# Patient Record
Sex: Female | Born: 1970 | Race: Black or African American | Hispanic: No | Marital: Single | Smoking: Never smoker
Health system: Southern US, Community
[De-identification: ages and names within clinical notes are randomized; demographics above are authoritative.]

## PROBLEM LIST (undated history)

## (undated) DIAGNOSIS — E119 Type 2 diabetes mellitus without complications: Secondary | ICD-10-CM

## (undated) HISTORY — PX: TUBAL LIGATION: SHX77

---

## 2018-05-07 ENCOUNTER — Ambulatory Visit: Payer: BLUE CROSS/BLUE SHIELD | Admitting: Dietician

## 2018-05-20 ENCOUNTER — Encounter: Payer: Self-pay | Admitting: Dietician

## 2018-05-20 NOTE — Progress Notes (Signed)
Patient cancelled her appointment for 05/07/18 as her insurance is not covering MNT service for diabetes. Sent letter to referring provider.

## 2020-12-06 ENCOUNTER — Encounter (HOSPITAL_COMMUNITY): Payer: Self-pay

## 2020-12-06 ENCOUNTER — Emergency Department (HOSPITAL_COMMUNITY): Payer: BC Managed Care – PPO

## 2020-12-06 ENCOUNTER — Encounter (HOSPITAL_COMMUNITY)
Admission: EM | Disposition: A | Discharge status: Home / Self Care | Payer: Self-pay | Source: Home / Self Care | Attending: Interventional Cardiology

## 2020-12-06 ENCOUNTER — Other Ambulatory Visit: Payer: Self-pay

## 2020-12-06 ENCOUNTER — Inpatient Hospital Stay (HOSPITAL_COMMUNITY)
Admission: EM | Admit: 2020-12-06 | Discharge: 2020-12-08 | DRG: 250 | Disposition: A | Discharge status: Home / Self Care | Payer: BC Managed Care – PPO | Attending: Interventional Cardiology | Admitting: Interventional Cardiology

## 2020-12-06 DIAGNOSIS — I10 Essential (primary) hypertension: Secondary | ICD-10-CM

## 2020-12-06 DIAGNOSIS — I5043 Acute on chronic combined systolic (congestive) and diastolic (congestive) heart failure: Secondary | ICD-10-CM | POA: Diagnosis present

## 2020-12-06 DIAGNOSIS — I472 Ventricular tachycardia: Secondary | ICD-10-CM | POA: Diagnosis present

## 2020-12-06 DIAGNOSIS — I4729 Other ventricular tachycardia: Secondary | ICD-10-CM

## 2020-12-06 DIAGNOSIS — E7800 Pure hypercholesterolemia, unspecified: Secondary | ICD-10-CM | POA: Diagnosis present

## 2020-12-06 DIAGNOSIS — Z6833 Body mass index (BMI) 33.0-33.9, adult: Secondary | ICD-10-CM | POA: Diagnosis not present

## 2020-12-06 DIAGNOSIS — Z794 Long term (current) use of insulin: Secondary | ICD-10-CM | POA: Diagnosis not present

## 2020-12-06 DIAGNOSIS — Z20822 Contact with and (suspected) exposure to covid-19: Secondary | ICD-10-CM | POA: Diagnosis present

## 2020-12-06 DIAGNOSIS — Z87892 Personal history of anaphylaxis: Secondary | ICD-10-CM | POA: Diagnosis not present

## 2020-12-06 DIAGNOSIS — E669 Obesity, unspecified: Secondary | ICD-10-CM | POA: Diagnosis present

## 2020-12-06 DIAGNOSIS — E785 Hyperlipidemia, unspecified: Secondary | ICD-10-CM | POA: Diagnosis not present

## 2020-12-06 DIAGNOSIS — IMO0002 Reserved for concepts with insufficient information to code with codable children: Secondary | ICD-10-CM

## 2020-12-06 DIAGNOSIS — Z888 Allergy status to other drugs, medicaments and biological substances status: Secondary | ICD-10-CM

## 2020-12-06 DIAGNOSIS — Z9851 Tubal ligation status: Secondary | ICD-10-CM

## 2020-12-06 DIAGNOSIS — I110 Hypertensive heart disease with heart failure: Secondary | ICD-10-CM | POA: Diagnosis present

## 2020-12-06 DIAGNOSIS — I2129 ST elevation (STEMI) myocardial infarction involving other sites: Principal | ICD-10-CM | POA: Diagnosis present

## 2020-12-06 DIAGNOSIS — I2582 Chronic total occlusion of coronary artery: Secondary | ICD-10-CM | POA: Diagnosis present

## 2020-12-06 DIAGNOSIS — I213 ST elevation (STEMI) myocardial infarction of unspecified site: Secondary | ICD-10-CM | POA: Diagnosis present

## 2020-12-06 DIAGNOSIS — E1165 Type 2 diabetes mellitus with hyperglycemia: Secondary | ICD-10-CM

## 2020-12-06 DIAGNOSIS — Z91013 Allergy to seafood: Secondary | ICD-10-CM

## 2020-12-06 DIAGNOSIS — I2102 ST elevation (STEMI) myocardial infarction involving left anterior descending coronary artery: Secondary | ICD-10-CM | POA: Diagnosis not present

## 2020-12-06 DIAGNOSIS — I2510 Atherosclerotic heart disease of native coronary artery without angina pectoris: Secondary | ICD-10-CM | POA: Diagnosis present

## 2020-12-06 HISTORY — PX: LEFT HEART CATH AND CORONARY ANGIOGRAPHY: CATH118249

## 2020-12-06 HISTORY — DX: Type 2 diabetes mellitus without complications: E11.9

## 2020-12-06 HISTORY — PX: CORONARY/GRAFT ACUTE MI REVASCULARIZATION: CATH118305

## 2020-12-06 LAB — CBC WITH DIFFERENTIAL/PLATELET
Abs Immature Granulocytes: 0.01 10*3/uL (ref 0.00–0.07)
Basophils Absolute: 0 10*3/uL (ref 0.0–0.1)
Basophils Relative: 0 %
Eosinophils Absolute: 0.2 10*3/uL (ref 0.0–0.5)
Eosinophils Relative: 3 %
HCT: 39.9 % (ref 36.0–46.0)
Hemoglobin: 13.3 g/dL (ref 12.0–15.0)
Immature Granulocytes: 0 %
Lymphocytes Relative: 39 %
Lymphs Abs: 2.3 10*3/uL (ref 0.7–4.0)
MCH: 27.5 pg (ref 26.0–34.0)
MCHC: 33.3 g/dL (ref 30.0–36.0)
MCV: 82.4 fL (ref 80.0–100.0)
Monocytes Absolute: 0.3 10*3/uL (ref 0.1–1.0)
Monocytes Relative: 6 %
Neutro Abs: 3.1 10*3/uL (ref 1.7–7.7)
Neutrophils Relative %: 52 %
Platelets: 218 10*3/uL (ref 150–400)
RBC: 4.84 MIL/uL (ref 3.87–5.11)
RDW: 11.3 % — ABNORMAL LOW (ref 11.5–15.5)
WBC: 6 10*3/uL (ref 4.0–10.5)
nRBC: 0 % (ref 0.0–0.2)

## 2020-12-06 LAB — CBC
HCT: 39.6 % (ref 36.0–46.0)
Hemoglobin: 13.7 g/dL (ref 12.0–15.0)
MCH: 28 pg (ref 26.0–34.0)
MCHC: 34.6 g/dL (ref 30.0–36.0)
MCV: 80.8 fL (ref 80.0–100.0)
Platelets: 224 10*3/uL (ref 150–400)
RBC: 4.9 MIL/uL (ref 3.87–5.11)
RDW: 11.4 % — ABNORMAL LOW (ref 11.5–15.5)
WBC: 7.6 10*3/uL (ref 4.0–10.5)
nRBC: 0 % (ref 0.0–0.2)

## 2020-12-06 LAB — BASIC METABOLIC PANEL
Anion gap: 13 (ref 5–15)
BUN: 11 mg/dL (ref 6–20)
CO2: 21 mmol/L — ABNORMAL LOW (ref 22–32)
Calcium: 9.7 mg/dL (ref 8.9–10.3)
Chloride: 97 mmol/L — ABNORMAL LOW (ref 98–111)
Creatinine, Ser: 0.75 mg/dL (ref 0.44–1.00)
GFR, Estimated: >60 mL/min (ref >60)
Glucose, Bld: 359 mg/dL — ABNORMAL HIGH (ref 70–99)
Potassium: 4.3 mmol/L (ref 3.5–5.1)
Sodium: 131 mmol/L — ABNORMAL LOW (ref 135–145)

## 2020-12-06 LAB — COMPREHENSIVE METABOLIC PANEL
ALT: 17 U/L (ref 0–44)
AST: 16 U/L (ref 15–41)
Albumin: 3.7 g/dL (ref 3.5–5.0)
Alkaline Phosphatase: 90 U/L (ref 38–126)
Anion gap: 11 (ref 5–15)
BUN: 13 mg/dL (ref 6–20)
CO2: 24 mmol/L (ref 22–32)
Calcium: 10 mg/dL (ref 8.9–10.3)
Chloride: 96 mmol/L — ABNORMAL LOW (ref 98–111)
Creatinine, Ser: 0.65 mg/dL (ref 0.44–1.00)
GFR, Estimated: >60 mL/min (ref >60)
Glucose, Bld: 383 mg/dL — ABNORMAL HIGH (ref 70–99)
Potassium: 3.8 mmol/L (ref 3.5–5.1)
Sodium: 131 mmol/L — ABNORMAL LOW (ref 135–145)
Total Bilirubin: 0.7 mg/dL (ref 0.3–1.2)
Total Protein: 6.9 g/dL (ref 6.5–8.1)

## 2020-12-06 LAB — POCT ACTIVATED CLOTTING TIME
Activated Clotting Time: 265 seconds
Activated Clotting Time: 358 seconds

## 2020-12-06 LAB — TROPONIN I (HIGH SENSITIVITY)
Troponin I (High Sensitivity): 435 ng/L (ref <18)
Troponin I (High Sensitivity): 369 ng/L (ref <18)
Troponin I (High Sensitivity): 2498 ng/L (ref <18)
Troponin I (High Sensitivity): 8192 ng/L (ref <18)

## 2020-12-06 LAB — CREATININE, SERUM
Creatinine, Ser: 0.67 mg/dL (ref 0.44–1.00)
GFR, Estimated: >60 mL/min (ref >60)

## 2020-12-06 LAB — MAGNESIUM: Magnesium: 1.5 mg/dL — ABNORMAL LOW (ref 1.7–2.4)

## 2020-12-06 LAB — HEMOGLOBIN A1C
Hgb A1c MFr Bld: 13.5 % — ABNORMAL HIGH (ref 4.8–5.6)
Mean Plasma Glucose: 340.75 mg/dL

## 2020-12-06 LAB — LIPASE, BLOOD: Lipase: 26 U/L (ref 11–51)

## 2020-12-06 LAB — RESP PANEL BY RT-PCR (FLU A&B, COVID) ARPGX2
Influenza A by PCR: NEGATIVE
Influenza B by PCR: NEGATIVE
SARS Coronavirus 2 by RT PCR: NEGATIVE

## 2020-12-06 LAB — GLUCOSE, CAPILLARY: Glucose-Capillary: 362 mg/dL — ABNORMAL HIGH (ref 70–99)

## 2020-12-06 SURGERY — LEFT HEART CATH AND CORONARY ANGIOGRAPHY
Anesthesia: LOCAL

## 2020-12-06 MED ORDER — METOPROLOL TARTRATE 25 MG PO TABS
25.0000 mg | ORAL_TABLET | Freq: Two times a day (BID) | ORAL | Status: DC
  Administered 2020-12-06 – 2020-12-08 (×4): 25 mg via ORAL
  Filled 2020-12-06 (×4): qty 1

## 2020-12-06 MED ORDER — NITROGLYCERIN 1 MG/10 ML FOR IR/CATH LAB
INTRA_ARTERIAL | Status: DC | PRN
  Administered 2020-12-06 (×2): 200 ug via INTRACORONARY

## 2020-12-06 MED ORDER — NITROGLYCERIN 1 MG/10 ML FOR IR/CATH LAB
INTRA_ARTERIAL | Status: AC
  Filled 2020-12-06: qty 10

## 2020-12-06 MED ORDER — NITROGLYCERIN IN D5W 200-5 MCG/ML-% IV SOLN
INTRAVENOUS | Status: AC
  Filled 2020-12-06: qty 250

## 2020-12-06 MED ORDER — INSULIN ASPART 100 UNIT/ML IJ SOLN
0.0000 [IU] | Freq: Every day | INTRAMUSCULAR | Status: DC
Start: 2020-12-06 — End: 2020-12-08
  Administered 2020-12-06: 5 [IU] via SUBCUTANEOUS

## 2020-12-06 MED ORDER — SODIUM CHLORIDE 0.9 % IV SOLN: INTRAVENOUS | Status: AC

## 2020-12-06 MED ORDER — METOPROLOL TARTRATE 25 MG PO TABS: 25.0000 mg | ORAL_TABLET | Freq: Two times a day (BID) | ORAL | Status: DC

## 2020-12-06 MED ORDER — LIDOCAINE HCL (PF) 1 % IJ SOLN
INTRAMUSCULAR | Status: AC
  Filled 2020-12-06: qty 30

## 2020-12-06 MED ORDER — FENTANYL CITRATE (PF) 100 MCG/2ML IJ SOLN
INTRAMUSCULAR | Status: AC
  Filled 2020-12-06: qty 2

## 2020-12-06 MED ORDER — CLOPIDOGREL BISULFATE 75 MG PO TABS: 75.0000 mg | ORAL_TABLET | Freq: Every day | ORAL | Status: DC

## 2020-12-06 MED ORDER — OXYCODONE HCL 5 MG PO TABS: 5.0000 mg | ORAL_TABLET | ORAL | Status: DC | PRN

## 2020-12-06 MED ORDER — LIDOCAINE HCL (PF) 1 % IJ SOLN
INTRAMUSCULAR | Status: DC | PRN
  Administered 2020-12-06: 2 mL

## 2020-12-06 MED ORDER — ATORVASTATIN CALCIUM 80 MG PO TABS
80.0000 mg | ORAL_TABLET | Freq: Every day | ORAL | Status: DC
  Administered 2020-12-06 – 2020-12-07 (×2): 80 mg via ORAL
  Filled 2020-12-06 (×3): qty 1

## 2020-12-06 MED ORDER — ENOXAPARIN SODIUM 40 MG/0.4ML IJ SOSY
40.0000 mg | PREFILLED_SYRINGE | INTRAMUSCULAR | Status: DC
  Administered 2020-12-07 – 2020-12-08 (×2): 40 mg via SUBCUTANEOUS
  Filled 2020-12-06 (×2): qty 0.4

## 2020-12-06 MED ORDER — HEPARIN SODIUM (PORCINE) 1000 UNIT/ML IJ SOLN
INTRAMUSCULAR | Status: DC | PRN
  Administered 2020-12-06: 3000 [IU] via INTRAVENOUS

## 2020-12-06 MED ORDER — SODIUM CHLORIDE 0.9 % IV SOLN: 250.0000 mL | INTRAVENOUS | Status: DC | PRN

## 2020-12-06 MED ORDER — INSULIN ASPART 100 UNIT/ML IJ SOLN
0.0000 [IU] | Freq: Three times a day (TID) | INTRAMUSCULAR | Status: DC
Start: 2020-12-06 — End: 2020-12-08
  Administered 2020-12-07 (×3): 5 [IU] via SUBCUTANEOUS
  Administered 2020-12-08 (×2): 3 [IU] via SUBCUTANEOUS

## 2020-12-06 MED ORDER — HEPARIN SODIUM (PORCINE) 1000 UNIT/ML IJ SOLN
INTRAMUSCULAR | Status: DC | PRN
  Administered 2020-12-06: 5000 [IU] via INTRAVENOUS

## 2020-12-06 MED ORDER — VERAPAMIL HCL 2.5 MG/ML IV SOLN
INTRAVENOUS | Status: AC
  Filled 2020-12-06: qty 2

## 2020-12-06 MED ORDER — HEPARIN BOLUS VIA INFUSION
4000.0000 [IU] | Freq: Once | INTRAVENOUS | Status: DC
  Filled 2020-12-06: qty 4000

## 2020-12-06 MED ORDER — ONDANSETRON HCL 4 MG/2ML IJ SOLN: 4.0000 mg | Freq: Four times a day (QID) | INTRAMUSCULAR | Status: DC | PRN

## 2020-12-06 MED ORDER — HEPARIN SODIUM (PORCINE) 5000 UNIT/ML IJ SOLN
INTRAMUSCULAR | Status: AC
  Administered 2020-12-06: 4000 [IU]
  Filled 2020-12-06: qty 1

## 2020-12-06 MED ORDER — CLOPIDOGREL BISULFATE 300 MG PO TABS
ORAL_TABLET | ORAL | Status: DC | PRN
  Administered 2020-12-06: 600 mg via ORAL

## 2020-12-06 MED ORDER — HEPARIN (PORCINE) IN NACL 1000-0.9 UT/500ML-% IV SOLN
INTRAVENOUS | Status: DC | PRN
  Administered 2020-12-06: 500 mL

## 2020-12-06 MED ORDER — HEPARIN (PORCINE) 25000 UT/250ML-% IV SOLN: 1000.0000 [IU]/h | INTRAVENOUS | Status: DC

## 2020-12-06 MED ORDER — LABETALOL HCL 5 MG/ML IV SOLN: 10.0000 mg | INTRAVENOUS | Status: AC | PRN

## 2020-12-06 MED ORDER — NITROGLYCERIN IN D5W 200-5 MCG/ML-% IV SOLN: 0.0000 ug/min | INTRAVENOUS | Status: DC

## 2020-12-06 MED ORDER — ASPIRIN 81 MG PO CHEW
324.0000 mg | CHEWABLE_TABLET | Freq: Once | ORAL | Status: AC
  Administered 2020-12-06: 324 mg via ORAL
  Filled 2020-12-06: qty 4

## 2020-12-06 MED ORDER — IOHEXOL 350 MG/ML SOLN
INTRAVENOUS | Status: DC | PRN
  Administered 2020-12-06: 175 mL via INTRA_ARTERIAL

## 2020-12-06 MED ORDER — SODIUM CHLORIDE 0.9% FLUSH
3.0000 mL | Freq: Two times a day (BID) | INTRAVENOUS | Status: DC
  Administered 2020-12-06 – 2020-12-08 (×4): 3 mL via INTRAVENOUS

## 2020-12-06 MED ORDER — MIDAZOLAM HCL 2 MG/2ML IJ SOLN
INTRAMUSCULAR | Status: DC | PRN
  Administered 2020-12-06: 1 mg via INTRAVENOUS

## 2020-12-06 MED ORDER — MIDAZOLAM HCL 2 MG/2ML IJ SOLN
INTRAMUSCULAR | Status: AC
  Filled 2020-12-06: qty 2

## 2020-12-06 MED ORDER — ASPIRIN 81 MG PO CHEW
81.0000 mg | CHEWABLE_TABLET | Freq: Every day | ORAL | Status: DC
  Administered 2020-12-06 – 2020-12-08 (×3): 81 mg via ORAL
  Filled 2020-12-06 (×3): qty 1

## 2020-12-06 MED ORDER — SODIUM CHLORIDE 0.9% FLUSH: 3.0000 mL | INTRAVENOUS | Status: DC | PRN

## 2020-12-06 MED ORDER — HEPARIN SODIUM (PORCINE) 1000 UNIT/ML IJ SOLN
INTRAMUSCULAR | Status: AC
  Filled 2020-12-06: qty 1

## 2020-12-06 MED ORDER — HEPARIN (PORCINE) IN NACL 1000-0.9 UT/500ML-% IV SOLN
INTRAVENOUS | Status: AC
  Filled 2020-12-06: qty 1000

## 2020-12-06 MED ORDER — NITROGLYCERIN IN D5W 200-5 MCG/ML-% IV SOLN
INTRAVENOUS | Status: AC | PRN
  Administered 2020-12-06: 10 ug/min via INTRAVENOUS

## 2020-12-06 MED ORDER — CLOPIDOGREL BISULFATE 75 MG PO TABS
75.0000 mg | ORAL_TABLET | Freq: Every day | ORAL | Status: DC
  Administered 2020-12-07 – 2020-12-08 (×2): 75 mg via ORAL
  Filled 2020-12-06 (×2): qty 1

## 2020-12-06 MED ORDER — VERAPAMIL HCL 2.5 MG/ML IV SOLN
INTRAVENOUS | Status: DC | PRN
  Administered 2020-12-06: 10 mL via INTRA_ARTERIAL

## 2020-12-06 MED ORDER — FENTANYL CITRATE (PF) 100 MCG/2ML IJ SOLN
INTRAMUSCULAR | Status: DC | PRN
  Administered 2020-12-06: 50 ug via INTRAVENOUS

## 2020-12-06 MED ORDER — CLOPIDOGREL BISULFATE 300 MG PO TABS
ORAL_TABLET | ORAL | Status: AC
  Filled 2020-12-06: qty 2

## 2020-12-06 MED ORDER — HYDRALAZINE HCL 20 MG/ML IJ SOLN: 10.0000 mg | INTRAMUSCULAR | Status: AC | PRN

## 2020-12-06 MED ORDER — NITROGLYCERIN 2 % TD OINT
0.5000 [in_us] | TOPICAL_OINTMENT | Freq: Once | TRANSDERMAL | Status: DC
  Filled 2020-12-06: qty 1

## 2020-12-06 MED ORDER — ACETAMINOPHEN 325 MG PO TABS: 650.0000 mg | ORAL_TABLET | ORAL | Status: DC | PRN

## 2020-12-06 SURGICAL SUPPLY — 17 items
BALLN SAPPHIRE 2.0X12 (BALLOONS) ×2
BALLOON SAPPHIRE 2.0X12 (BALLOONS) ×1 IMPLANT
CATH 5FR JL3.5 JR4 ANG PIG MP (CATHETERS) ×2 IMPLANT
CATH VISTA GUIDE 6FR XB3.5 (CATHETERS) ×2 IMPLANT
DEVICE RAD COMP TR BAND LRG (VASCULAR PRODUCTS) ×2 IMPLANT
GLIDESHEATH SLEND A-KIT 6F 22G (SHEATH) ×2 IMPLANT
GLIDESHEATH SLEND SS 6F .021 (SHEATH) IMPLANT
GUIDEWIRE INQWIRE 1.5J.035X260 (WIRE) ×1 IMPLANT
INQWIRE 1.5J .035X260CM (WIRE) ×2
KIT ENCORE 26 ADVANTAGE (KITS) ×2 IMPLANT
KIT HEART LEFT (KITS) ×2 IMPLANT
MAT PREVALON FULL STRYKER (MISCELLANEOUS) ×2 IMPLANT
PACK CARDIAC CATHETERIZATION (CUSTOM PROCEDURE TRAY) ×2 IMPLANT
SHEATH PROBE COVER 6X72 (BAG) ×2 IMPLANT
TRANSDUCER W/STOPCOCK (MISCELLANEOUS) ×2 IMPLANT
TUBING CIL FLEX 10 FLL-RA (TUBING) ×2 IMPLANT
WIRE ASAHI PROWATER 180CM (WIRE) ×2 IMPLANT

## 2020-12-06 NOTE — ED Notes (Signed)
Attached to zoll, stemi pads, cath lab remains in room preparing patient for cath lab.

## 2020-12-06 NOTE — ED Triage Notes (Signed)
Pt reports chest pain that started last night, took antacids to see if that would help but no relief. Pt denies any other symptoms.

## 2020-12-06 NOTE — CV Procedure (Signed)
Occluded small first diagonal.  Able to wire and dilate, reestablishing TIMI grade II flow.  Did not feel the blood vessel was of sufficient size to attempt optimal recanalization for fear of rupture. Other arteries are widely patent. LV function appears diminished with anterolateral wall motion abnormality.  EF 35%.  LVEDP 27 mmHg.  This is consistent with acute on chronic combined systolic and diastolic heart failure.  Plan IV nitroglycerin, IV Lasix, beta-blocker therapy, and aggressive risk factor modification.

## 2020-12-06 NOTE — ED Provider Notes (Signed)
Emergency Medicine Provider Triage Evaluation Note  Jessica Maddox , a 50 y.o. female  was evaluated in triage.  Pt complains of chest pain.  Symptoms began last night with minimal improvement with antacids.  Pain got worse today.  No shortness of breath, vomiting, cough or fever.  Has not experienced pain like this before, no history of CAD or PE that she is aware of.  Review of Systems  Positive: Chest pain Negative: Shortness of breath, vomiting  Physical Exam  BP (!) 163/72   Pulse 89   Temp 98 F (36.7 C) (Oral)   Resp 20   Ht 5\' 5"  (1.651 m)   Wt 93 kg   SpO2 98%   BMI 34.11 kg/m  Gen:   Awake, no distress   Resp:  Normal effort  MSK:   Moves extremities without difficulty  Other:  No distress  Medical Decision Making  Medically screening exam initiated at 1:45 PM.  Appropriate orders placed.  Jessica Maddox was informed that the remainder of the evaluation will be completed by another provider, this initial triage assessment does not replace that evaluation, and the importance of remaining in the ED until their evaluation is complete.  Lab work ordered   Jessica Bourbon, PA-C 12/06/20 1346    12/08/20, MD 12/06/20 1358

## 2020-12-06 NOTE — Progress Notes (Signed)
ANTICOAGULATION CONSULT NOTE - Initial Consult  Pharmacy Consult for heparin Indication: chest pain/ACS  Not on File  Patient Measurements: Height: 5\' 5"  (165.1 cm) Weight: 93 kg (205 lb) IBW/kg (Calculated) : 57 Heparin Dosing Weight: 78 kg   Vital Signs: Temp: 98 F (36.7 C) (08/17 1341) Temp Source: Oral (08/17 1341) BP: 165/102 (08/17 1555) Pulse Rate: 98 (08/17 1555)  Labs: Recent Labs    12/06/20 1352  HGB 13.3  HCT 39.9  PLT 218  CREATININE 0.65  TROPONINIHS 435*    Estimated Creatinine Clearance: 94.8 mL/min (by C-G formula based on SCr of 0.65 mg/dL).   Medical History: Past Medical History:  Diagnosis Date   Diabetes mellitus without complication (HCC)     Medications:  (Not in a hospital admission)   Assessment: 9 YOF with chest pain and elevated troponin to start IV heparin for ACS. H/H and Plt wnl. SCr wnl   Goal of Therapy:  Heparin level 0.3-0.7 units/ml Monitor platelets by anticoagulation protocol: Yes   Plan:  -Heparin 4000 units IV bolus followed by heparin infusion at 1000 units/hr -F/u 6 hr HL -Monitor daily HL, CBC and s/s of bleeding   44, PharmD., BCPS, BCCCP Clinical Pharmacist Please refer to [facility] for unit-specific pharmacist

## 2020-12-06 NOTE — ED Notes (Signed)
Cardiology provider and cath lab at bedside.

## 2020-12-06 NOTE — H&P (Signed)
Cardiology Admission History and Physical:   Patient ID: Jessica Maddox MRN: [id number]; DOB: 07/19/1970   Admission date: 12/06/2020  PCP:  Dion Body, MD   [facility] Providers Cardiologist:  Delmer Islam     Chief Complaint:  STEMI  Patient Profile:   Jessica Maddox is a 50 y.o. 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  The discomfort was mild.  Despite the discomfort she and her mother went for the usual evening walk.  Walking does not aggravate the discomfort.  The discomfort gradually resolved.  When she awakened this morning there was no chest discomfort.  Starting at around noon time the discomfort came back, was characterized as being 6-8 out of 10 in severity, and in the emergency room was noted to have an elevated troponin I and ST elevation in 1, aVL, and V2 with reciprocal inferior ST segment depression.  Initially in the emergency room the acute nature of the EKG was not initially recognized.  A request was made to review the EKGs that the been obtained and I did confirm with the physician that the patient appeared to be having a lateral wall STEMI.  Activation then occurred.  Patient was met in the emergency room and brought to the catheterization laboratory for investigation.  She was accompanied by her mother who also came to the to heart waiting area.   Past Medical History:  Diagnosis Date   Diabetes mellitus without complication ([place])     Past Surgical History:  Procedure Laterality Date   TUBAL LIGATION       Medications Prior to Admission: Prior to Admission medications   Not on File     Allergies:   Not on File  Social History:    Social History   Socioeconomic History   Marital status: Single    Spouse name: Not on file   Number of children: Not on file   Years of education: Not on file   Highest education level: Not on file  Occupational History   Not on file  Tobacco Use   Smoking status: Not on file   Smokeless tobacco: Not on file  Substance and Sexual Activity   Alcohol use: Not on file   Drug use: Not on file   Sexual activity: Not on file  Other Topics Concern   Not on file  Social History Narrative   Not on file   Social Determinants of Health   Financial Resource Strain: Not on file  Food Insecurity: Not on file  Transportation Needs: Not on file  Physical Activity: Not on file  Stress: Not on file  Social Connections: Not on file  Intimate Partner Violence: Not on file    Family History: Mother with history of myocardial infarction The patient's family history is not on file.    ROS:  Please see the history of present illness.  She has no prior history of heart disease.  Has not about blood pressure for couple years.  Diabetes for slightly longer.  Control has been poor.  Not a smoker.  All other ROS reviewed and negative.     Physical Exam/Data:   Vitals:   12/06/20 1739 12/06/20 1744 12/06/20 1744 12/06/20 1749  BP: (!) 142/86  Marland Kitchen)  163/85 139/87  Pulse: (!) 103 99 100 (!) 0  Resp: 18 (!) 32 (!) 0 (!) 0  Temp:      TempSrc:      SpO2: 100% 100% 100% (!) 0%  Weight:      Height:       No intake or output data in the 24 hours ending 12/06/20 1839 Last 3 Weights 12/06/2020  Weight (lbs) 205 lb  Weight (kg) 92.987 kg     Body mass index is 34.11 kg/m.  General:  Well nourished, well developed, in no acute distress.  Overweight. HEENT: normal Lymph: no adenopathy Neck: no JVD Endocrine:  No thryomegaly Vascular: No carotid bruits; FA pulses 2+ bilaterally without bruits  Cardiac:  normal S1, S2; RRR; no murmur  Lungs:  clear to auscultation bilaterally, no wheezing,  rhonchi or rales  Abd: soft, nontender, no hepatomegaly  Ext: no edema Musculoskeletal:  No deformities, BUE and BLE strength normal and equal Skin: warm and dry  Neuro:  CNs 2-12 intact, no focal abnormalities noted Psych:  Normal affect    EKG:  The ECG that was done at 12/06/2020 at 1:31 PM was personally reviewed and demonstrates half millimeter of ST elevation V2, aVL, and aVF with reciprocal ST segment depression in II, III, and aVF.  Relevant CV Studies: No data in the [place] records  Laboratory Data:  High Sensitivity Troponin:   Recent Labs  Lab 12/06/20 1352 12/06/20 1620  TROPONINIHS 435* 369*      Chemistry Recent Labs  Lab 12/06/20 1352  NA 131*  K 3.8  CL 96*  CO2 24  GLUCOSE 383*  BUN 13  CREATININE 0.65  CALCIUM 10.0  GFRNONAA >60  ANIONGAP 11    Recent Labs  Lab 12/06/20 1352  PROT 6.9  ALBUMIN 3.7  AST 16  ALT 17  ALKPHOS 90  BILITOT 0.7   Hematology Recent Labs  Lab 12/06/20 1352  WBC 6.0  RBC 4.84  HGB 13.3  HCT 39.9  MCV 82.4  MCH 27.5  MCHC 33.3  RDW 11.3*  PLT 218   BNPNo results for input(s): BNP, PROBNP in the last 168 hours.  DDimer No results for input(s): DDIMER in the last 168 hours.   Radiology/Studies:  DG Chest 2 View  Result Date: 12/06/2020 CLINICAL DATA:  Chest pain EXAM: CHEST - 2 VIEW COMPARISON:  None. FINDINGS: The heart size and mediastinal contours are within normal limits. Both lungs are clear. The visualized skeletal structures are unremarkable. IMPRESSION: No active cardiopulmonary disease. Electronically Signed   By: Kathreen Devoid M.D.   On: 12/06/2020 14:49   CARDIAC CATHETERIZATION  Result Date: 12/06/2020   Lateral STEMI due to occlusion of small first diagonal treated with balloon angioplasty only resulting in 80% stenosis with TIMI grade II flow.  Vessel was felt to be too small to aggressively dilate.   Right dominant coronary anatomy with widely patent vessel.   Normal LAD other than the  totally occluded first diagonal   Normal left circumflex   Normal left main RECOMMENDATIONS Aggressive blood pressure control and risk factor modification of sugar, lipids, and other lifestyle changes. 2D Doppler echocardiogram Therapy to include beta-blocker, long-acting nitrates for the next 24 to 48 hours via IV nitroglycerin Aspirin and Plavix for at least 6 months Cardiac rehabilitation    Assessment and Plan:   Lateral wall ST elevation myocardial infarction commencing at 12 noon with subtle EKG changes. Diabetes mellitus type 2, poor control Hypertension,  primary with poor control Hyperlipidemia, on rosuvastatin with uncertain control  PLAN:  Emergency coronary angiography with mechanical reperfusion and stenting if feasible.  Discussed with patient and mother who understand the approach.  Radial approach will be attempted.  Risk of bleeding, death, myocardial infarction, stroke, kidney injury, among others discussed and accepted by the patient.   Risk Assessment/Risk Scores:  }  TIMI Risk Score for ST  Elevation MI:   The patient's TIMI risk score is 4, which indicates a 7.3% risk of all cause mortality at 30 days.     CRITICAL CARE TIME 35 minutes   Severity of Illness: The appropriate patient status for this patient is INPATIENT. Inpatient status is judged to be reasonable and necessary in order to provide the required intensity of service to ensure the patient's safety. The patient's presenting symptoms, physical exam findings, and initial radiographic and laboratory data in the context of their chronic comorbidities is felt to place them at high risk for further clinical deterioration. Furthermore, it is not anticipated that the patient will be medically stable for discharge from the hospital within 2 midnights of admission. The following factors support the patient status of inpatient.   " The patient's presenting symptoms include chest pain. " The worrisome physical exam  findings include none. " The initial radiographic and laboratory data are worrisome because of STEMI pattern on ECG. " The chronic co-morbidities include DM II, Hypertension.   * I certify that at the point of admission it is my clinical judgment that the patient will require inpatient hospital care spanning beyond 2 midnights from the point of admission due to high intensity of service, high risk for further deterioration and high frequency of surveillance required.*   For questions or updates, please contact [place] Please consult [email or web address] for contact info under     Signed, Sinclair Grooms, MD  12/06/2020 6:39 PM

## 2020-12-06 NOTE — ED Provider Notes (Signed)
[facility] EMERGENCY DEPARTMENT Provider Note   CSN: [id number] Arrival date & time: 12/06/20  1329     History Chief Complaint  Patient presents with   Chest Pain    Jessica Maddox is a 50 y.o. female.  Patient states chest pain started last night at 6 PM and was intermittent.  Chest pain started again around noon today and has been persistent.  History of diabetes, high cholesterol, hypertension.  The history is provided by the patient.  Chest Pain Pain location:  Substernal area Pain quality: pressure   Pain radiates to:  Does not radiate Pain severity:  Mild Onset quality:  Gradual Duration:  1 day Timing:  Intermittent Progression:  Worsening Chronicity:  New Context: at rest   Relieved by:  Nothing Worsened by:  Nothing Ineffective treatments:  None tried Associated symptoms: no abdominal pain, no back pain, no cough, no fever, no palpitations, no shortness of breath and no vomiting   Risk factors: diabetes mellitus, high cholesterol and hypertension       Past Medical History:  Diagnosis Date   Diabetes mellitus without complication [facility])     Patient Active Problem List   Diagnosis Date Noted   Diabetes mellitus type II, uncontrolled (HCC) 12/06/2020   Primary hypertension 12/06/2020   STEMI (ST elevation myocardial infarction) (HCC) 12/06/2020    Past Surgical History:  Procedure Laterality Date   TUBAL LIGATION       OB History   No obstetric history on file.     No family history on file.     Home Medications Prior to Admission medications   Not on File    Allergies    Patient has no allergy information on record.  Review of Systems   Review of Systems  Constitutional:  Negative for chills and fever.  HENT:  Negative for ear pain and sore throat.   Eyes:  Negative for pain and visual disturbance.  Respiratory:  Negative for cough and shortness of breath.   Cardiovascular:  Positive for chest pain. Negative  for palpitations.  Gastrointestinal:  Negative for abdominal pain and vomiting.  Genitourinary:  Negative for dysuria and hematuria.  Musculoskeletal:  Negative for arthralgias and back pain.  Skin:  Negative for color change and rash.  Neurological:  Negative for seizures and syncope.  All other systems reviewed and are negative.  Physical Exam Updated Vital Signs BP (!) 165/91   Pulse 90   Temp 98 F (36.7 C) (Oral)   Resp 14   Ht 5\' 5"  (1.651 m)   Wt 93 kg   SpO2 100%   BMI 34.11 kg/m   Physical Exam Vitals and nursing note reviewed.  Constitutional:      General: She is not in acute distress.    Appearance: She is well-developed. She is not ill-appearing.  HENT:     Head: Normocephalic and atraumatic.     Mouth/Throat:     Mouth: Mucous membranes are moist.  Eyes:     Extraocular Movements: Extraocular movements intact.     Conjunctiva/sclera: Conjunctivae normal.     Pupils: Pupils are equal, round, and reactive to light.  Cardiovascular:     Rate and Rhythm: Normal rate and regular rhythm.     Pulses: Normal pulses.          Radial pulses are 2+ on the right side and 2+ on the left side.     Heart sounds: Normal heart sounds. No murmur heard. Pulmonary:  Effort: Pulmonary effort is normal. No respiratory distress.     Breath sounds: Normal breath sounds. No decreased breath sounds or rhonchi.  Chest:     Chest wall: No tenderness.  Abdominal:     Palpations: Abdomen is soft.     Tenderness: There is no abdominal tenderness.  Musculoskeletal:     Cervical back: Normal range of motion and neck supple.  Skin:    General: Skin is warm and dry.     Capillary Refill: Capillary refill takes less than 2 seconds.  Neurological:     General: No focal deficit present.     Mental Status: She is alert.  Psychiatric:        Mood and Affect: Mood normal.    ED Results / Procedures / Treatments   Labs (all labs ordered are listed, but only abnormal results are  displayed) Labs Reviewed  COMPREHENSIVE METABOLIC PANEL - Abnormal; Notable for the following components:      Result Value   Sodium 131 (*)    Chloride 96 (*)    Glucose, Bld 383 (*)    All other components within normal limits  CBC WITH DIFFERENTIAL/PLATELET - Abnormal; Notable for the following components:   RDW 11.3 (*)    All other components within normal limits  TROPONIN I (HIGH SENSITIVITY) - Abnormal; Notable for the following components:   Troponin I (High Sensitivity) 435 (*)    All other components within normal limits  RESP PANEL BY RT-PCR (FLU A&B, COVID) ARPGX2  LIPASE, BLOOD  HEPARIN LEVEL (UNFRACTIONATED)  TROPONIN I (HIGH SENSITIVITY)    EKG EKG Interpretation  Date/Time:  Wednesday December 06 2020 16:13:31 EDT Ventricular Rate:  91 PR Interval:  157 QRS Duration: 93 QT Interval:  384 QTC Calculation: 473 R Axis:   -67 Text Interpretation: Sinus rhythm, St depressions inferioly with ST elevation in aVL and V2 Left anterior fascicular block Probable anteroseptal infarct, old Confirmed by Virgina Norfolk [phone]) on 12/06/2020 4:44:57 PM  Radiology DG Chest 2 View  Result Date: 12/06/2020 CLINICAL DATA:  Chest pain EXAM: CHEST - 2 VIEW COMPARISON:  None. FINDINGS: The heart size and mediastinal contours are within normal limits. Both lungs are clear. The visualized skeletal structures are unremarkable. IMPRESSION: No active cardiopulmonary disease. Electronically Signed   By: Elige Ko M.D.   On: 12/06/2020 14:49    Procedures .Critical Care  Date/Time: 12/06/2020 4:47 PM Performed by: Virgina Norfolk, DO Authorized by: Virgina Norfolk, DO   Critical care provider statement:    Critical care time (minutes):  40   Critical care was necessary to treat or prevent imminent or life-threatening deterioration of the following conditions:  Cardiac failure   Critical care was time spent personally by me on the following activities:  Blood draw for specimens,  development of treatment plan with patient or surrogate, discussions with primary provider, evaluation of patient's response to treatment, examination of patient, ordering and performing treatments and interventions, ordering and review of laboratory studies, ordering and review of radiographic studies, pulse oximetry, re-evaluation of patient's condition, review of old charts and obtaining history from patient or surrogate   Care discussed with: admitting provider     Medications Ordered in ED Medications  nitroGLYCERIN (NITROGLYN) 2 % ointment 0.5 inch ( Topical MAR Hold 12/06/20 1640)  heparin bolus via infusion 4,000 Units ( Intravenous MAR Hold 12/06/20 1640)  heparin ADULT infusion 100 units/mL (25000 units/24mL) (has no administration in time range)  Heparin (Porcine) in NaCl 1000-0.9  UT/500ML-% SOLN (500 mLs  Given 12/06/20 1647)  Heparin (Porcine) in NaCl 1000-0.9 UT/500ML-% SOLN (500 mLs  Given 12/06/20 1647)  aspirin chewable tablet 324 mg (324 mg Oral Given 12/06/20 1632)  heparin 5000 UNIT/ML injection (4,000 Units  Given 12/06/20 1634)    ED Course  I have reviewed the triage vital signs and the nursing notes.  Pertinent labs & imaging results that were available during my care of the patient were reviewed by me and considered in my medical decision making (see chart for details).    MDM Rules/Calculators/A&P                           Jessica Maddox is here with chest pain.  History of high cholesterol, diabetes, hypertension.  Patient arrives with mild hypertension but otherwise normal vitals.  Chest pain since last night at 6 PM and that was intermittent but now has been more persistent since noon which is about 4 hours ago.  Triage EKG shows ST depressions in inferior with some ST elevation in V2 and aVL and may be 1.  Patient had troponin of 435.  Patient was brought back to room when troponin resulted at 435.  Initial EKG was done about 3 hours ago and reviewed by other  provider.  Concerning for subtle ST elevation upon my initial review of EKGs and code STEMI was activated, especially given elevated troponin and I talked with Dr. Katrinka Blazing with interventional cardiology on the phone who agreed.  Patient started on heparin bolus and infusion and given aspirin and given nitroglycerin paste.  Went directly to the Cath Lab.  Remaining lab work was unremarkable including chest x-ray.  This chart was dictated using voice recognition software.  Despite best efforts to proofread,  errors can occur which can change the documentation meaning.     Final Clinical Impression(s) / ED Diagnoses Final diagnoses:  ST elevation myocardial infarction (STEMI), unspecified artery [facility])    Rx / DC Orders ED Discharge Orders     None        Virgina Norfolk, DO 12/06/20 1651

## 2020-12-06 NOTE — Progress Notes (Signed)
    Received call from RN stating patient had significant run of NSVT. Patient remained asymptomatic with stable BP. Labs drawn. Will add Mg+. Would also give metoprolol 25mg  early. Med scheduled for 2200. Follow closely on telemetry. If recurrent episodes or if patient becomes symptomatic, inform cardiology.   NP-C [facility] Pager: [phone]

## 2020-12-07 ENCOUNTER — Inpatient Hospital Stay (HOSPITAL_COMMUNITY): Payer: BC Managed Care – PPO

## 2020-12-07 ENCOUNTER — Encounter (HOSPITAL_COMMUNITY): Payer: Self-pay | Admitting: Interventional Cardiology

## 2020-12-07 DIAGNOSIS — I2129 ST elevation (STEMI) myocardial infarction involving other sites: Principal | ICD-10-CM

## 2020-12-07 LAB — CBC
HCT: 35.5 % — ABNORMAL LOW (ref 36.0–46.0)
Hemoglobin: 11.9 g/dL — ABNORMAL LOW (ref 12.0–15.0)
MCH: 27.4 pg (ref 26.0–34.0)
MCHC: 33.5 g/dL (ref 30.0–36.0)
MCV: 81.6 fL (ref 80.0–100.0)
Platelets: 212 10*3/uL (ref 150–400)
RBC: 4.35 MIL/uL (ref 3.87–5.11)
RDW: 11.5 % (ref 11.5–15.5)
WBC: 6 10*3/uL (ref 4.0–10.5)
nRBC: 0 % (ref 0.0–0.2)

## 2020-12-07 LAB — BASIC METABOLIC PANEL
Anion gap: 11 (ref 5–15)
BUN: 12 mg/dL (ref 6–20)
CO2: 24 mmol/L (ref 22–32)
Calcium: 9.2 mg/dL (ref 8.9–10.3)
Chloride: 98 mmol/L (ref 98–111)
Creatinine, Ser: 0.65 mg/dL (ref 0.44–1.00)
GFR, Estimated: >60 mL/min (ref >60)
Glucose, Bld: 263 mg/dL — ABNORMAL HIGH (ref 70–99)
Potassium: 3.7 mmol/L (ref 3.5–5.1)
Sodium: 133 mmol/L — ABNORMAL LOW (ref 135–145)

## 2020-12-07 LAB — LIPID PANEL
Cholesterol: 218 mg/dL — ABNORMAL HIGH (ref 0–200)
HDL: 44 mg/dL (ref >40)
LDL Cholesterol: 150 mg/dL — ABNORMAL HIGH (ref 0–99)
Total CHOL/HDL Ratio: 5 RATIO
Triglycerides: 118 mg/dL (ref <150)
VLDL: 24 mg/dL (ref 0–40)

## 2020-12-07 LAB — ECHOCARDIOGRAM COMPLETE
AR max vel: 2.36 cm2
AV Area VTI: 2.31 cm2
AV Area mean vel: 2.26 cm2
AV Mean grad: 4 mmHg
AV Peak grad: 6.8 mmHg
Ao pk vel: 1.3 m/s
Area-P 1/2: 3.72 cm2
Height: 65 in
S' Lateral: 3 cm
Weight: 3234.59 oz

## 2020-12-07 LAB — MRSA NEXT GEN BY PCR, NASAL: MRSA by PCR Next Gen: DETECTED — AB

## 2020-12-07 LAB — GLUCOSE, CAPILLARY
Glucose-Capillary: 222 mg/dL — ABNORMAL HIGH (ref 70–99)
Glucose-Capillary: 234 mg/dL — ABNORMAL HIGH (ref 70–99)
Glucose-Capillary: 157 mg/dL — ABNORMAL HIGH (ref 70–99)

## 2020-12-07 MED ORDER — CHLORHEXIDINE GLUCONATE CLOTH 2 % EX PADS
6.0000 | MEDICATED_PAD | Freq: Every day | CUTANEOUS | Status: DC
  Administered 2020-12-07: 6 via TOPICAL

## 2020-12-07 MED ORDER — ROSUVASTATIN CALCIUM 20 MG PO TABS
40.0000 mg | ORAL_TABLET | Freq: Every day | ORAL | Status: DC
  Administered 2020-12-08: 40 mg via ORAL
  Filled 2020-12-07: qty 2

## 2020-12-07 MED ORDER — INSULIN GLARGINE-YFGN 100 UNIT/ML ~~LOC~~ SOLN
15.0000 [IU] | Freq: Every day | SUBCUTANEOUS | Status: DC
  Administered 2020-12-07 – 2020-12-08 (×2): 15 [IU] via SUBCUTANEOUS
  Filled 2020-12-07 (×2): qty 0.15

## 2020-12-07 MED ORDER — POTASSIUM CHLORIDE CRYS ER 20 MEQ PO TBCR
40.0000 meq | EXTENDED_RELEASE_TABLET | Freq: Once | ORAL | Status: AC
  Administered 2020-12-07: 40 meq via ORAL
  Filled 2020-12-07: qty 2

## 2020-12-07 MED ORDER — MUPIROCIN 2 % EX OINT
TOPICAL_OINTMENT | Freq: Two times a day (BID) | CUTANEOUS | Status: DC
  Filled 2020-12-07 (×2): qty 22

## 2020-12-07 MED ORDER — EZETIMIBE 10 MG PO TABS
10.0000 mg | ORAL_TABLET | Freq: Every day | ORAL | Status: DC
  Administered 2020-12-07 – 2020-12-08 (×2): 10 mg via ORAL
  Filled 2020-12-07 (×2): qty 1

## 2020-12-07 NOTE — Discharge Instructions (Signed)

## 2020-12-07 NOTE — Plan of Care (Signed)
  Problem: Education: Goal: Knowledge of General Education information will improve Description Including pain rating scale, medication(s)/side effects and non-pharmacologic comfort measures Outcome: Progressing   

## 2020-12-07 NOTE — Progress Notes (Addendum)
Inpatient Diabetes Program Recommendations  AACE/ADA: New Consensus Statement on Inpatient Glycemic Control   Target Ranges:  Prepandial:   less than 140 mg/dL      Peak postprandial:   less than 180 mg/dL (1-2 hours)      Critically ill patients:  140 - 180 mg/dL   Results for DEIDRE, CARINO (MRN [id number]) as of 12/07/2020 08:19  Ref. Range 12/06/2020 22:15 12/07/2020 06:39  Glucose-Capillary Latest Ref Range: 70 - 99 mg/dL 850 (H) 277 (H)   Results for ZOFIA, PECKINPAUGH (MRN [id number]) as of 12/07/2020 08:19  Ref. Range 12/06/2020 19:25  Hemoglobin A1C Latest Ref Range: 4.8 - 5.6 % 13.5 (H)   Review of Glycemic Control  Diabetes history: DM2 Outpatient Diabetes medications: Lantus 20 units QHS, Novolog 7 units TID with meals, Amaryl 4 mg BID Current orders for Inpatient glycemic control: Novolog 0-15 units TID with meals, Novolog 0-5 units QHS  Inpatient Diabetes Program Recommendations:    Insulin: Please consider ordering Semglee 15 units daily.  HbgA1C:  A1C 13.5% on 12/06/20 indicating an average glucose of 341 mg/dl over the past 2-3 months.  Addendum 12/07/20@12 :55-Spoke with patient about diabetes and home regimen for diabetes control. Patient reports being followed by PCP for diabetes management and she notes she missed her last appointment due to being out of town with work.  Encouraged patient to call PCP and make a follow up appointment.  Patient reports she is taking Lantus 20 units QHS, Novolog 7 units TID with meals, and Amaryl 4 mg BID as an outpatient for diabetes control. Patient reports she was prescribed Metformin in the past but did not tolerate it due to GI upset.  In looking back at chart, noted she had been prescribed Metformin 1000 mg BID (per office note on 02/03/2018 by Dr. Burnadette Pop).  Patient reports that glucose was better when she was taking Metformin.  Discussed asking her provider to try Metformin XR (if appropriate and not contraindicated) to see if she  has less GI effect.  Patient reports consistently taking DM medications as prescribed.  Patient reports that she is not checking glucose as often as she should but notes she checked it last Thursday and it was in the 200's.  Discussed A1C results (13.5% on 12/06/20 ) and explained that current A1C indicates an average glucose of 341 mg/dl over the past 2-3 months. Discussed glucose and A1C goals. Discussed importance of checking CBGs and maintaining good CBG control to prevent long-term and short-term complications. Explained how hyperglycemia leads to damage within blood vessels which lead to the common complications seen with uncontrolled diabetes. Stressed to the patient the importance of improving glycemic control to prevent further complications from uncontrolled diabetes. Discussed impact of nutrition, exercise, stress, sickness, and medications on diabetes control.  Discussed carbohydrates, carbohydrate goals per day and meal, along with portion sizes. Patient states she drinks mainly water and some sweet tea. Encouraged patient to eliminate sweet tea and use unsweetened with artificial sweetners if needed.  Encouraged patient to check glucose as advised by providers and be sure to follow up with PCP. Discussed asking PCP about referral to an Endocrinologist for assistance with getting DM under better control.  Patient verbalized understanding of information discussed and reports no further questions at this time related to diabetes.  Thanks, Orlando Penner, RN, MSN, CDCES Diabetes Coordinator Inpatient Diabetes Program [phone] (Team Pager from 8am to 5pm)

## 2020-12-07 NOTE — Progress Notes (Signed)
  Echocardiogram 2D Echocardiogram has been performed.  Roosvelt Maser F 12/07/2020, 3:34 PM

## 2020-12-07 NOTE — Progress Notes (Signed)
CARDIAC REHAB PHASE I   Left education materials with pt. Pt denies questions or concerns regarding materials at this time. Denies CP or SOB. Pending echo. Will continue to follow.  [id number] Reynold Bowen, RN BSN 12/07/2020 2:42 PM

## 2020-12-07 NOTE — Progress Notes (Addendum)
Progress Note  Patient Name: Jessica Maddox Date of Encounter: 12/07/2020  [facility] Cardiologist: None Smith  Subjective   No chest pain.  Feels much better  Inpatient Medications    Scheduled Meds:  aspirin  81 mg Oral Daily   atorvastatin  80 mg Oral Daily   Chlorhexidine Gluconate Cloth  6 each Topical Daily   clopidogrel  75 mg Oral Q breakfast   enoxaparin (LOVENOX) injection  40 mg Subcutaneous Q24H   insulin aspart  0-15 Units Subcutaneous TID WC   insulin aspart  0-5 Units Subcutaneous QHS   metoprolol tartrate  25 mg Oral BID   mupirocin ointment   Nasal BID   sodium chloride flush  3 mL Intravenous Q12H   Continuous Infusions:  sodium chloride     nitroGLYCERIN 20 mcg/min (12/07/20 0200)   PRN Meds: sodium chloride, acetaminophen, ondansetron (ZOFRAN) IV, oxyCODONE, sodium chloride flush   Vital Signs    Vitals:   12/07/20 0700 12/07/20 0800 12/07/20 0812 12/07/20 0900  BP: 119/73 120/69  134/65  Pulse: 86 79  81  Resp: 17 14  13   Temp:   98.9 F (37.2 C)   TempSrc:   Oral   SpO2: 95% 96%  96%  Weight:      Height:        Intake/Output Summary (Last 24 hours) at 12/07/2020 1008 Last data filed at 12/07/2020 0200 Gross per 24 hour  Intake 812.56 ml  Output 850 ml  Net -37.44 ml   Last 3 Weights 12/06/2020 12/06/2020  Weight (lbs) 202 lb 2.6 oz 205 lb  Weight (kg) 91.7 kg 92.987 kg      Telemetry    Normal sinus rhythm- Personally Reviewed  ECG    Normal sinus rhythm, lateral ST elevation has improved- Personally Reviewed  Physical Exam   GEN: No acute distress.   Neck: No JVD Cardiac: RRR, no murmurs, rubs, or gallops.  Respiratory: Clear to auscultation bilaterally. GI: Soft, nontender, non-distended  MS: No edema; No deformity.  Right radial site without hematoma, 2+ radial pulse Neuro:  Nonfocal  Psych: Normal affect   Labs    High Sensitivity Troponin:   Recent Labs  Lab 12/06/20 1352 12/06/20 1620 12/06/20 1925  12/06/20 2125  TROPONINIHS 435* 369* 2,498* 8,192*      Chemistry Recent Labs  Lab 12/06/20 1352 12/06/20 1925 12/06/20 2129 12/07/20 0102  NA 131*  --  131* 133*  K 3.8  --  4.3 3.7  CL 96*  --  97* 98  CO2 24  --  21* 24  GLUCOSE 383*  --  359* 263*  BUN 13  --  11 12  CREATININE 0.65 0.67 0.75 0.65  CALCIUM 10.0  --  9.7 9.2  PROT 6.9  --   --   --   ALBUMIN 3.7  --   --   --   AST 16  --   --   --   ALT 17  --   --   --   ALKPHOS 90  --   --   --   BILITOT 0.7  --   --   --   GFRNONAA >60 >60 >60 >60  ANIONGAP 11  --  13 11     Hematology Recent Labs  Lab 12/06/20 1352 12/06/20 1925 12/07/20 0102  WBC 6.0 7.6 6.0  RBC 4.84 4.90 4.35  HGB 13.3 13.7 11.9*  HCT 39.9 39.6 35.5*  MCV 82.4 80.8 81.6  MCH 27.5 28.0 27.4  MCHC 33.3 34.6 33.5  RDW 11.3* 11.4* 11.5  PLT 218 224 212    BNPNo results for input(s): BNP, PROBNP in the last 168 hours.   DDimer No results for input(s): DDIMER in the last 168 hours.   Radiology    DG Chest 2 View  Result Date: 12/06/2020 CLINICAL DATA:  Chest pain EXAM: CHEST - 2 VIEW COMPARISON:  None. FINDINGS: The heart size and mediastinal contours are within normal limits. Both lungs are clear. The visualized skeletal structures are unremarkable. IMPRESSION: No active cardiopulmonary disease. Electronically Signed   By: Elige Ko M.D.   On: 12/06/2020 14:49   CARDIAC CATHETERIZATION  Result Date: 12/06/2020   Lateral STEMI due to occlusion of small first diagonal treated with balloon angioplasty only resulting in 80% stenosis with TIMI grade II flow.  Vessel was felt to be too small to aggressively dilate.   Right dominant coronary anatomy with widely patent vessel.   Normal LAD other than the totally occluded first diagonal   Normal left circumflex   Normal left main RECOMMENDATIONS Aggressive blood pressure control and risk factor modification of sugar, lipids, and other lifestyle changes. 2D Doppler echocardiogram Therapy to  include beta-blocker, long-acting nitrates for the next 24 to 48 hours via IV nitroglycerin Aspirin and Plavix for at least 6 months Cardiac rehabilitation   Cardiac Studies   Cath films personally reviewed  Patient Profile     50 y.o. female with diagonal occlusion causing STEMI  Assessment & Plan    CAD/ STEMI: Balloon angioplasty done of the small diagonal vessel.  She needs aggressive risk factor modification.  Ejection fraction is moderately decreased but should improve with time.  Echo pending.  Diabetes: Whole food, plant-based diet recommended.  Aggressive medical therapy.  Poorly controlled.  We spoke about the importance of getting her sugar down to prevent future events.  Hyperlipidemia: High-dose statin.  Hypertension: Blood pressure was somewhat low earlier this morning.  We will continue to monitor.  Add ACE inhibitor, ARB or Entresto depending on LVEF from echo.  Moderately decreased by ventriculogram but may improve now that flow has been restored.  For questions or updates, please contact [facility] Please consult [email or web address] for contact info under        Signed, Lance Muss, MD  12/07/2020, 10:08 AM

## 2020-12-08 ENCOUNTER — Other Ambulatory Visit (HOSPITAL_COMMUNITY): Payer: Self-pay

## 2020-12-08 ENCOUNTER — Encounter (HOSPITAL_COMMUNITY): Payer: Self-pay | Admitting: Interventional Cardiology

## 2020-12-08 DIAGNOSIS — I4729 Other ventricular tachycardia: Secondary | ICD-10-CM

## 2020-12-08 DIAGNOSIS — E785 Hyperlipidemia, unspecified: Secondary | ICD-10-CM

## 2020-12-08 DIAGNOSIS — I472 Ventricular tachycardia: Secondary | ICD-10-CM

## 2020-12-08 LAB — MAGNESIUM: Magnesium: 1.9 mg/dL (ref 1.7–2.4)

## 2020-12-08 LAB — CBC
HCT: 38.8 % (ref 36.0–46.0)
Hemoglobin: 13.2 g/dL (ref 12.0–15.0)
MCH: 28.1 pg (ref 26.0–34.0)
MCHC: 34 g/dL (ref 30.0–36.0)
MCV: 82.6 fL (ref 80.0–100.0)
Platelets: 238 10*3/uL (ref 150–400)
RBC: 4.7 MIL/uL (ref 3.87–5.11)
RDW: 11.5 % (ref 11.5–15.5)
WBC: 5.5 10*3/uL (ref 4.0–10.5)
nRBC: 0 % (ref 0.0–0.2)

## 2020-12-08 LAB — GLUCOSE, CAPILLARY
Glucose-Capillary: 153 mg/dL — ABNORMAL HIGH (ref 70–99)
Glucose-Capillary: 192 mg/dL — ABNORMAL HIGH (ref 70–99)

## 2020-12-08 MED ORDER — POTASSIUM CHLORIDE CRYS ER 20 MEQ PO TBCR
40.0000 meq | EXTENDED_RELEASE_TABLET | Freq: Once | ORAL | Status: AC
  Administered 2020-12-08: 40 meq via ORAL
  Filled 2020-12-08: qty 2

## 2020-12-08 MED ORDER — METFORMIN HCL 500 MG PO TABS
500.0000 mg | ORAL_TABLET | Freq: Two times a day (BID) | ORAL | 11 refills | Status: AC
  Filled 2020-12-08: qty 60, 30d supply, fill #0

## 2020-12-08 MED ORDER — DAPAGLIFLOZIN PROPANEDIOL 10 MG PO TABS
10.0000 mg | ORAL_TABLET | Freq: Every day | ORAL | 11 refills | Status: DC
  Filled 2020-12-08: qty 30, 30d supply, fill #0

## 2020-12-08 MED ORDER — MAGNESIUM SULFATE 2 GM/50ML IV SOLN
2.0000 g | Freq: Once | INTRAVENOUS | Status: AC
  Administered 2020-12-08: 2 g via INTRAVENOUS
  Filled 2020-12-08: qty 50

## 2020-12-08 MED ORDER — METOPROLOL SUCCINATE ER 50 MG PO TB24
50.0000 mg | ORAL_TABLET | Freq: Every day | ORAL | 11 refills | Status: DC
  Filled 2020-12-08: qty 30, 30d supply, fill #0

## 2020-12-08 MED ORDER — ROSUVASTATIN CALCIUM 20 MG PO TABS: 40.0000 mg | ORAL_TABLET | Freq: Every day | ORAL | Status: DC

## 2020-12-08 MED ORDER — NITROGLYCERIN 0.4 MG SL SUBL
0.4000 mg | SUBLINGUAL_TABLET | SUBLINGUAL | 1 refills | Status: AC | PRN
  Filled 2020-12-08: qty 25, 7d supply, fill #0

## 2020-12-08 MED ORDER — CLOPIDOGREL BISULFATE 75 MG PO TABS
75.0000 mg | ORAL_TABLET | Freq: Every day | ORAL | 3 refills | Status: DC
  Filled 2020-12-08: qty 30, 30d supply, fill #0

## 2020-12-08 MED ORDER — METOPROLOL SUCCINATE ER 50 MG PO TB24: 50.0000 mg | ORAL_TABLET | Freq: Every day | ORAL | Status: DC

## 2020-12-08 MED ORDER — EZETIMIBE 10 MG PO TABS
10.0000 mg | ORAL_TABLET | Freq: Every day | ORAL | 11 refills | Status: DC
  Filled 2020-12-08: qty 30, 30d supply, fill #0

## 2020-12-08 NOTE — Progress Notes (Signed)
CARDIAC REHAB PHASE I   PRE:  Rate/Rhythm: 88 SR  BP:  Sitting: 119/68     SaO2: 97 RA  MODE:  Ambulation: 320 ft   POST:  Rate/Rhythm: 104 ST  BP:  Sitting: 119/71    SaO2: 97 RA   Pt ambulated 372ft in hallway independently with steady gait. Pt denies CP, SOB, or dizziness. Pt educated on importance of ASA and Plavix. Pt given MI book along with heart healthy and diabetic diets. Reviewed site care, restrictions, and exercise guidelines. Will refer to CRP II [place]. Pt requests note for work if she needs to be out, APP made aware.  [id number] Reynold Bowen, RN BSN 12/08/2020 9:03 AM

## 2020-12-08 NOTE — Discharge Summary (Addendum)
Discharge Summary    Patient ID: Jessica Maddox MRN: [id number]; DOB: 28-Feb-1971  Admit date: 12/15/2020 Discharge date: 12/08/2020  PCP:  Dion Body, MD   [facility] Providers Cardiologist:  Sinclair Grooms, MD    Discharge Diagnoses    Principal Problem:   ST elevation myocardial infarction (STEMI) of lateral wall [facility]) Active Problems:   Diabetes mellitus type II, uncontrolled ([place])   Primary hypertension   STEMI (ST elevation myocardial infarction) ([place])   Hyperlipidemia with target LDL less than 70   NSVT (nonsustained ventricular tachycardia) ([place])   Diagnostic Studies/Procedures    Heart cath 12/15/20:    Lateral STEMI due to occlusion of small first diagonal treated with balloon angioplasty only resulting in 80% stenosis with TIMI grade II flow.  Vessel was felt to be too small to aggressively dilate.   Right dominant coronary anatomy with widely patent vessel.   Normal LAD other than the totally occluded first diagonal   Normal left circumflex   Normal left main   RECOMMENDATIONS Aggressive blood pressure control and risk factor modification of sugar, lipids, and other lifestyle changes. 2D Doppler echocardiogram Therapy to include beta-blocker, long-acting nitrates for the next 24 to 48 hours via IV nitroglycerin Aspirin and Plavix for at least 6 months Cardiac rehabilitation   Diagnostic Dominance: Right  _____________   Echo 2020/12/15: 1. Left ventricular ejection fraction, by estimation, is 55 to 60%. The  left ventricle has normal function. The left ventricle has no regional  wall motion abnormalities. Left ventricular diastolic parameters were  normal.   2. Right ventricular systolic function is normal. The right ventricular  size is normal. Tricuspid regurgitation signal is inadequate for assessing  PA pressure.   3. The mitral valve is normal in structure. No evidence of mitral valve  regurgitation. No evidence of mitral  stenosis.   4. The aortic valve was not well visualized. Aortic valve regurgitation  is not visualized. No aortic stenosis is present.   5. The inferior vena cava is normal in size with greater than 50%  respiratory variability, suggesting right atrial pressure of 3 mmHg.    History of Present Illness     Jessica Maddox is a 50 y.o. female with insulin-dependent diabetes, hyperlipidemia, obesity, and hypertension who is being seen 2020-12-15 for the evaluation of prolonged chest pain since noon time and subtle EKG evidence of lateral wall ST elevation MI.  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  The discomfort was mild.  Despite the discomfort she and her mother went for the usual evening walk.  Walking does not aggravate the discomfort.  The discomfort gradually resolved.  When she awakened this morning there was no chest discomfort.  Starting at around noon time the discomfort came back, was characterized as being 6-8 out of 10 in severity, and in the emergency room was noted to have an elevated troponin I and ST elevation in 1, aVL, and V2 with reciprocal inferior ST segment depression.  Initially in the emergency room the acute nature of the EKG was not initially recognized.  A request was made to review the EKGs that the been obtained and I did confirm with the physician that the patient appeared to be having a lateral wall STEMI.  Activation then occurred.  Patient was met in the emergency room and brought to the catheterization laboratory for investigation.  She was accompanied by her mother who also came to the to heart waiting area.  Hospital Course     Consultants: none  Lateral wall STEMI An 80% D1 stenosis too small for stenting was treated with balloon angioplasty. She tolerated the procedure well. Initial suspicion for reduced EF. Follow up echo the next day showed LVEF 55-60%. CE peaked at 8200. She was started on ASA and  plavix, 25 mg metoprolol BID. Consolidate tartrate to succinate.     NSVT Pt had a run of NSVT and PVCs the evening of her cath. Mg was 1.5 on 12/06/20. Does not appear she was supplemented. Will administer 2g IV prior to discharge and supplement K. Will add on Mg level. Recheck electrolytes at follow up.    Hypertension Continue BB. Hold home 2.5 mg lisinopril, follow in [place] clinic.    Hyperlipidemia with LDL goal < 70 12/06/2020: Cholesterol 218; HDL 44; LDL Cholesterol 150; Triglycerides 118; VLDL 24 Increased crestor from 20 mg to 40 mg crestor. Zetia was added.  Recheck lipids in 6 weeks. May need lipid clinic for consideration of PCSK9i.   IDDM with hyperglycemia A1c 13.5%. Home regimen includes insulin and amaryl. Will start 10 mg farxiga, 500 mg metformin BID, and D/C amaryl. Amaryl has some data that suggest possible increased risk of cardiovascular events.  BG was 263 at 0100 last night. Will need better glucose control to prevent progression of CAD. Will refer to endocrinology.    Pt seen and examined by Dr. Irish Lack and deemed stable for discharge. She lives in [place], but prefers to follow up in [place]. I have arranged follow up and messaged the [facility] pool.    Did the patient have an acute coronary syndrome (MI, NSTEMI, STEMI, etc) this admission?:  Yes                               AHA/ACC Clinical Performance & Quality Measures: Aspirin prescribed? - Yes ADP Receptor Inhibitor (Plavix/Clopidogrel, Brilinta/Ticagrelor or Effient/Prasugrel) prescribed (includes medically managed patients)? - Yes Beta Blocker prescribed? - Yes High Intensity Statin (Lipitor 40-26m or Crestor 20-48m prescribed? - Yes EF assessed during THIS hospitalization? - Yes For EF <40%, was ACEI/ARB prescribed? - Not Applicable (EF >/= 4044%For EF <40%, Aldosterone Antagonist (Spironolactone or Eplerenone) prescribed? - Not Applicable (EF >/= 4001%Cardiac Rehab Phase II ordered (including  medically managed patients)? - Yes      _____________  Discharge Vitals Blood pressure 120/74, pulse 88, temperature 97.7 F (36.5 C), temperature source Oral, resp. rate 14, height _0  (1.651 m), weight 91.4 kg, SpO2 94 %.  Filed Weights   12/06/20 1341 12/06/20 1856 12/07/20 2017  Weight: 93 kg 91.7 kg 91.4 kg    Labs & Radiologic Studies    CBC Recent Labs    12/06/20 1352 12/06/20 1925 12/07/20 0102 12/08/20 0055  WBC 6.0   < > 6.0 5.5  NEUTROABS 3.1  --   --   --   HGB 13.3   < > 11.9* 13.2  HCT 39.9   < > 35.5* 38.8  MCV 82.4   < > 81.6 82.6  PLT 218   < > 212 238   < > = values in this interval not displayed.   Basic Metabolic Panel Recent Labs    12/06/20 1939 12/06/20 2129 12/07/20 0102  NA  --  131* 133*  K  --  4.3 3.7  CL  --  97* 98  CO2  --  21* 24  GLUCOSE  --  359* 263*  BUN  --  11 12  CREATININE  --  0.75 0.65  CALCIUM  --  9.7 9.2  MG 1.5*  --   --    Liver Function Tests Recent Labs    12/06/20 1352  AST 16  ALT 17  ALKPHOS 90  BILITOT 0.7  PROT 6.9  ALBUMIN 3.7   Recent Labs    12/06/20 1352  LIPASE 26   High Sensitivity Troponin:   Recent Labs  Lab 12/06/20 1352 12/06/20 1620 12/06/20 1925 12/06/20 2125  TROPONINIHS 435* 369* 2,498* 8,192*    BNP Invalid input(s): POCBNP D-Dimer No results for input(s): DDIMER in the last 72 hours. Hemoglobin A1C Recent Labs    12/06/20 1925  HGBA1C 13.5*   Fasting Lipid Panel Recent Labs    12/06/20 2129  CHOL 218*  HDL 44  LDLCALC 150*  TRIG 118  CHOLHDL 5.0   Thyroid Function Tests No results for input(s): TSH, T4TOTAL, T3FREE, THYROIDAB in the last 72 hours.  Invalid input(s): FREET3 _____________  DG Chest 2 View  Result Date: 12/06/2020 CLINICAL DATA:  Chest pain EXAM: CHEST - 2 VIEW COMPARISON:  None. FINDINGS: The heart size and mediastinal contours are within normal limits. Both lungs are clear. The visualized skeletal structures are unremarkable.  IMPRESSION: No active cardiopulmonary disease. Electronically Signed   By: Kathreen Devoid M.D.   On: 12/06/2020 14:49   CARDIAC CATHETERIZATION  Result Date: 12/06/2020   Lateral STEMI due to occlusion of small first diagonal treated with balloon angioplasty only resulting in 80% stenosis with TIMI grade II flow.  Vessel was felt to be too small to aggressively dilate.   Right dominant coronary anatomy with widely patent vessel.   Normal LAD other than the totally occluded first diagonal   Normal left circumflex   Normal left main RECOMMENDATIONS Aggressive blood pressure control and risk factor modification of sugar, lipids, and other lifestyle changes. 2D Doppler echocardiogram Therapy to include beta-blocker, long-acting nitrates for the next 24 to 48 hours via IV nitroglycerin Aspirin and Plavix for at least 6 months Cardiac rehabilitation  ECHOCARDIOGRAM COMPLETE  Result Date: 12/07/2020    ECHOCARDIOGRAM REPORT   Patient Name:   SHANAI LARTIGUE Date of Exam: 12/07/2020 Medical Rec #:  [id number]         Height:       65.0 in Accession #:    [id number]        Weight:       202.2 lb Date of Birth:  01-28-1971         BSA:          1.987 m Patient Age:    50 years          BP:           129/83 mmHg Patient Gender: F                 HR:           78 bpm. Exam Location:  Inpatient Procedure: 2D Echo, Cardiac Doppler and Color Doppler Indications:    Acute myocardial infarction, unspecified I21.9  History:        Patient has no prior history of Echocardiogram examinations.  Sonographer:    Merrie Roof RDCS Referring Phys: [id number] [place]  1. Left ventricular ejection fraction, by estimation, is 55 to 60%. The left ventricle has normal function. The left ventricle has no regional wall motion abnormalities. Left ventricular diastolic parameters were  normal.  2. Right ventricular systolic function is normal. The right ventricular size is normal. Tricuspid regurgitation signal is inadequate  for assessing PA pressure.  3. The mitral valve is normal in structure. No evidence of mitral valve regurgitation. No evidence of mitral stenosis.  4. The aortic valve was not well visualized. Aortic valve regurgitation is not visualized. No aortic stenosis is present.  5. The inferior vena cava is normal in size with greater than 50% respiratory variability, suggesting right atrial pressure of 3 mmHg. FINDINGS  Left Ventricle: Left ventricular ejection fraction, by estimation, is 55 to 60%. The left ventricle has normal function. The left ventricle has no regional wall motion abnormalities. The left ventricular internal cavity size was normal in size. There is  no left ventricular hypertrophy. Left ventricular diastolic parameters were normal. Right Ventricle: The right ventricular size is normal. No increase in right ventricular wall thickness. Right ventricular systolic function is normal. Tricuspid regurgitation signal is inadequate for assessing PA pressure. Left Atrium: Left atrial size was normal in size. Right Atrium: Right atrial size was normal in size. Pericardium: Trivial pericardial effusion is present. Mitral Valve: The mitral valve is normal in structure. No evidence of mitral valve regurgitation. No evidence of mitral valve stenosis. Tricuspid Valve: The tricuspid valve is normal in structure. Tricuspid valve regurgitation is trivial. Aortic Valve: The aortic valve was not well visualized. Aortic valve regurgitation is not visualized. No aortic stenosis is present. Aortic valve mean gradient measures 4.0 mmHg. Aortic valve peak gradient measures 6.8 mmHg. Aortic valve area, by VTI measures 2.31 cm. Pulmonic Valve: The pulmonic valve was not well visualized. Pulmonic valve regurgitation is not visualized. Aorta: The aortic root and ascending aorta are structurally normal, with no evidence of dilitation. Venous: The inferior vena cava is normal in size with greater than 50% respiratory variability,  suggesting right atrial pressure of 3 mmHg. IAS/Shunts: The interatrial septum was not well visualized.  LEFT VENTRICLE PLAX 2D LVIDd:         4.40 cm  Diastology LVIDs:         3.00 cm  LV e' medial:    7.18 cm/s LV PW:         0.90 cm  LV E/e' medial:  10.8 LV IVS:        0.80 cm  LV e' lateral:   11.00 cm/s LVOT diam:     1.90 cm  LV E/e' lateral: 7.1 LV SV:         62 LV SV Index:   31 LVOT Area:     2.84 cm  RIGHT VENTRICLE RV Basal diam:  2.90 cm LEFT ATRIUM             Index       RIGHT ATRIUM           Index LA diam:        2.90 cm 1.46 cm/m  RA Area:     10.30 cm LA Vol (A2C):   47.2 ml 23.75 ml/m RA Volume:   22.20 ml  11.17 ml/m LA Vol (A4C):   31.0 ml 15.60 ml/m LA Biplane Vol: 38.2 ml 19.22 ml/m  AORTIC VALVE AV Area (Vmax):    2.36 cm AV Area (Vmean):   2.26 cm AV Area (VTI):     2.31 cm AV Vmax:           130.00 cm/s AV Vmean:          90.500 cm/s AV VTI:  0.270 m AV Peak Grad:      6.8 mmHg AV Mean Grad:      4.0 mmHg LVOT Vmax:         108.00 cm/s LVOT Vmean:        72.000 cm/s LVOT VTI:          0.220 m LVOT/AV VTI ratio: 0.81  AORTA Ao Root diam: 2.70 cm Ao Asc diam:  2.80 cm MITRAL VALVE MV Area (PHT): 3.72 cm     SHUNTS MV Decel Time: 204 msec     Systemic VTI:  0.22 m MV E velocity: 77.80 cm/s   Systemic Diam: 1.90 cm MV A velocity: 106.00 cm/s MV E/A ratio:  0.73 Oswaldo Milian MD Electronically signed by Oswaldo Milian MD Signature Date/Time: 12/07/2020/5:39:33 PM    Final    Disposition   Pt is being discharged home today in good condition.  Follow-up Plans & Appointments     Follow-up Information     Belva Crome, MD Follow up on 12/21/2020.   Specialty: Cardiology Why: 11:40 am Contact information: [address] [place] [address] [phone]                Discharge Instructions     Amb Referral to Cardiac Rehabilitation   Complete by: As directed    Diagnosis: STEMI   After initial evaluation and  assessments completed: Virtual Based Care may be provided alone or in conjunction with Phase 2 Cardiac Rehab based on patient barriers.: Yes   Ambulatory referral to Endocrinology   Complete by: As directed    Uncontrolled DM   Diet - low sodium heart healthy   Complete by: As directed    Discharge instructions   Complete by: As directed    No driving for 1 week. No lifting over 5 lbs for 1 week. No sexual activity for 1 week. You may return to work in 1 week with light duty. Keep procedure site clean & dry. If you notice increased pain, swelling, bleeding or pus, call/return!  You may shower, but no soaking baths/hot tubs/pools for 1 week.   Increase activity slowly   Complete by: As directed        Discharge Medications   Allergies as of 12/08/2020       Reactions   Iodine Anaphylaxis   Shellfish Allergy Anaphylaxis   Metformin Diarrhea        Medication List     STOP taking these medications    aspirin-sod bicarb-citric acid 325 MG Tbef tablet Commonly known as: ALKA-SELTZER   glimepiride 4 MG tablet Commonly known as: AMARYL   lisinopril 2.5 MG tablet Commonly known as: ZESTRIL       TAKE these medications    aspirin EC 81 MG tablet Take 81 mg by mouth daily. Swallow whole.   clopidogrel 75 MG tablet Commonly known as: PLAVIX Take 1 tablet (75 mg total) by mouth daily with breakfast. Start taking on: December 09, 2020   dapagliflozin propanediol 10 MG Tabs tablet Commonly known as: Farxiga Take 1 tablet (10 mg total) by mouth daily before breakfast.   ezetimibe 10 MG tablet Commonly known as: ZETIA Take 1 tablet (10 mg total) by mouth daily. Start taking on: December 09, 2020   insulin aspart 100 UNIT/ML injection Commonly known as: novoLOG Inject 7 Units into the skin 3 (three) times daily before meals.   insulin glargine 100 UNIT/ML injection Commonly known as: LANTUS Inject 20 Units into  the skin at bedtime.   metFORMIN 500 MG tablet Commonly  known as: Glucophage Take 1 tablet (500 mg total) by mouth 2 (two) times daily with a meal. Start taking on: December 09, 2020   metoprolol succinate 50 MG 24 hr tablet Commonly known as: TOPROL-XL Take 1 tablet (50 mg total) by mouth daily. Take with or immediately following a meal. Start taking on: December 09, 2020   nitroGLYCERIN 0.4 MG SL tablet Commonly known as: Nitrostat Place 1 tablet (0.4 mg total) under the tongue every 5 (five) minutes as needed for chest pain.   rosuvastatin 20 MG tablet Commonly known as: CRESTOR Take 2 tablets (40 mg total) by mouth daily. What changed: how much to take           Outstanding Labs/Studies   none  Duration of Discharge Encounter   Greater than 30 minutes including physician time.  Signed, Tami Lin Duke, PA 12/08/2020, 10:44 AM  I have examined the patient and reviewed assessment and plan and discussed with patient.  Agree with above as stated.    GEN: Well nourished, well developed, in no acute distress  HEENT: normal  Neck: no JVD, carotid bruits, or masses Cardiac: RRR; no murmurs, rubs, or gallops,no edema  Respiratory:  clear to auscultation bilaterally, normal work of breathing GI: soft, nontender, nondistended,  MS: no deformity or atrophy ;right radial artery without hematoma Skin: warm and dry, no rash Neuro:  Strength and sensation are intact Psych: euthymic mood, full affect   Small lateral wall MI related to diagonal occlusion, treated with PTCA.  Continue DAPT with clop9dogrel for 1 year.  RF modification stresed.  Whole food, plant based diet.  INcrease exercise. Avoid tobacco. Normal LVEF.OK for discharge today.  Larae Grooms

## 2020-12-11 ENCOUNTER — Telehealth: Payer: Self-pay

## 2020-12-11 NOTE — Telephone Encounter (Signed)
**Note De-identified Keamber Macfadden Obfuscation** -----  **Note De-Identified Achille Xiang Obfuscation** Message from Marcelino Duster, [place] sent at 12/08/2020 10:40 AM EDT ----- Pt is a TOC, discharging this afternoon.  Thanks Angie

## 2020-12-11 NOTE — Telephone Encounter (Signed)
**Note De-Identified Quianna Avery Obfuscation** Patient contacted regarding discharge from [facility] on 12/08/2020.  Patient understands to follow up with Dr Katrinka Blazing on 12/21/2020 at 11:40 at [address] in [place], [place] [address] Patient understands discharge instructions? Yes Patient understands medications and regiment? Yes Patient understands to bring all medications to this visit? Yes  Ask patient:  Are you enrolled in My Chart Not yet but states that she may sign up later.  The pt states that she is doing well at this time and denies CP/discomfort, SOB, dizziness, lightheadedness, nausea, or diaphoresis.  She thanked me for calling her and states that she does have Dr [facility] office phone number at [facility] to call if she has any questions or concerns.

## 2020-12-14 ENCOUNTER — Other Ambulatory Visit (HOSPITAL_COMMUNITY): Payer: Self-pay

## 2020-12-15 ENCOUNTER — Other Ambulatory Visit (HOSPITAL_COMMUNITY): Payer: Self-pay

## 2020-12-19 ENCOUNTER — Other Ambulatory Visit (HOSPITAL_COMMUNITY): Payer: Self-pay

## 2020-12-19 ENCOUNTER — Telehealth (HOSPITAL_COMMUNITY): Payer: Self-pay | Admitting: Pharmacist

## 2020-12-19 NOTE — Telephone Encounter (Signed)
Pharmacy Transitions of Care Follow-up Telephone Call  Date of discharge: 12/08/20  Discharge Diagnosis: STEMI with balloon angioplasty  How have you been since you were released from the hospital?  Pt reports she is doing "just fine" and has returned to work  Medication changes made at discharge:  - START:  clopidogrel (PLAVIX)  ezetimibe (ZETIA)  Farxiga (dapagliflozin propanediol)  metFORMIN (Glucophage)  metoprolol succinate (TOPROL-XL)  nitroGLYCERIN (Nitrostat)   - STOPPED:  aspirin-sod bicarb-citric acid 325 MG Tbef tablet (ALKA-SELTZER)  glimepiride 4 MG tablet (AMARYL)  lisinopril 2.5 MG tablet (ZESTRIL)    - CHANGED:  rosuvastatin (CRESTOR)   Medication changes verified by the patient? Yes    Medication Accessibility:  Home Pharmacy:  [facility]  - [address]  Was the patient provided with refills on discharged medications? yes   Have all prescriptions been transferred from [facility] to home pharmacy?  Yes  Is the patient able to afford medications? Has insurance Notable copays: Marcelline Deist ($0/mo on test claim) Eligible patient assistance: Farxiga coupon ($0/mo)    Medication Review:  CLOPIDOGREL (PLAVIX) Clopidogrel 75 mg once daily.  - Educated patient on expected duration of therapy of ASA with clopidogrel for at least 6 mo. Advised patient that aspirin may be continued indefinitely.  - Reviewed potential DDIs with patient  - Advised patient of medications to avoid (NSAIDs, ASA)  - Educated that Tylenol (acetaminophen) will be the preferred analgesic to prevent risk of bleeding  - Emphasized importance of monitoring for signs and symptoms of bleeding (abnormal bruising, prolonged bleeding, nose bleeds, bleeding from gums, discolored urine, black tarry stools)  - Advised patient to alert all providers of anticoagulation therapy prior to starting a new medication or having a procedure    Follow-up Appointments:  PCP Hospital f/u appt confirmed?  None scheduled   Specialist Hospital f/u appt confirmed? Yes Scheduled to see Dr. Verdis Prime, [facility] on 12/21/20 @ 11:40.   Cardiac Rehab? Will discuss with Dr. Katrinka Blazing  If their condition worsens, is the pt aware to call PCP or go to the Emergency Dept.? YES  Final Patient Assessment:   Pt was a pleasure to speak with and was well informed about her medication regimen.  She confirmed that she is taking meds correctly and has made appropriate changes.  Overall pt is doing well.

## 2020-12-20 NOTE — Progress Notes (Signed)
Cardiology Office Note:    Date:  12/21/2020   ID:  [name], DOB [birth year], MRN [id number]  PCP:  Marisue Ivan, MD  Cardiologist:  Lesleigh Noe, MD   Referring MD: Marisue Ivan, MD   Chief Complaint  Patient presents with   Coronary Artery Disease   Hypertension   Hyperlipidemia    History of Present Illness:    Jessica Maddox is a 50 y.o. 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  No recurrence of chest pain since PCI.  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  Work is not physically stressful.  No side effects to the recent up titration/additions to her medical regimen.  Denies orthopnea and PND.  Has been monitoring blood glucose levels.  Past Medical History:  Diagnosis Date   Diabetes mellitus without complication [facility])     Past Surgical History:  Procedure Laterality Date   CORONARY/GRAFT ACUTE MI REVASCULARIZATION N/A 12/06/2020   Procedure: Coronary/Graft Acute MI Revascularization;  Surgeon: Lyn Records, MD;  Location: [facility] INVASIVE CV LAB;  Service: Cardiovascular;  Laterality: N/A;   LEFT HEART CATH AND CORONARY ANGIOGRAPHY N/A 12/06/2020   Procedure: LEFT HEART CATH AND CORONARY ANGIOGRAPHY;  Surgeon: Lyn Records, MD;  Location: [facility] INVASIVE CV LAB;  Service: Cardiovascular;  Laterality: N/A;   TUBAL LIGATION      Current Medications: Current Meds  Medication Sig   aspirin EC 81 MG tablet Take 81 mg by mouth daily. Swallow whole.   clopidogrel (PLAVIX) 75 MG tablet Take 1 tablet (75 mg total) by mouth daily with breakfast.   dapagliflozin propanediol (FARXIGA) 10 MG TABS tablet Take 1 tablet (10 mg total) by mouth daily before breakfast.   ezetimibe (ZETIA) 10 MG tablet Take 1 tablet (10 mg total) by mouth daily.    insulin aspart (NOVOLOG) 100 UNIT/ML injection Inject 7 Units into the skin 3 (three) times daily before meals.   insulin glargine (LANTUS) 100 UNIT/ML injection Inject 20 Units into the skin at bedtime.   metFORMIN (GLUCOPHAGE) 500 MG tablet Take 1 tablet (500 mg total) by mouth 2 (two) times daily with a meal.   metoprolol succinate (TOPROL-XL) 50 MG 24 hr tablet Take 1 tablet (50 mg total) by mouth daily. Take with or immediately following a meal.   nitroGLYCERIN (NITROSTAT) 0.4 MG SL tablet Place 1 tablet (0.4 mg total) under the tongue every 5 (five) minutes as needed for chest pain.   rosuvastatin (CRESTOR) 20 MG tablet Take 2 tablets (40 mg total) by mouth daily.     Allergies:   Iodine, Shellfish allergy, and Metformin   Social History   Socioeconomic History   Marital status: Single    Spouse name: Not on file   Number of children: Not on file   Years of education: Not on file   Highest education level: Not on file  Occupational History   Not on file  Tobacco Use   Smoking status: Never   Smokeless tobacco: Never  Substance and Sexual Activity   Alcohol use: Not on file   Drug use: Yes   Sexual activity: Not on file  Other Topics Concern   Not on file  Social History Narrative   Not on file   Social Determinants of Health  Financial Resource Strain: Not on file  Food Insecurity: Not on file  Transportation Needs: Not on file  Physical Activity: Not on file  Stress: Not on file  Social Connections: Not on file     Family History: The patient's family history is not on file.  ROS:   Please see the history of present illness.    Has not followed up with primary care yet.  Sees Dr. Lester Boonton at congenital clinic in [place].  No other complaints.  All other systems reviewed and are negative.  EKGs/Labs/Other Studies Reviewed:    The following studies were reviewed today:  Coronary angiography 12/06/2020 with POBA: Diagnostic Dominance:  Right Intervention    2D Doppler echocardiogram 12/07/2020:  IMPRESSIONS     1. Left ventricular ejection fraction, by estimation, is 55 to 60%. The  left ventricle has normal function. The left ventricle has no regional  wall motion abnormalities. Left ventricular diastolic parameters were  normal.   2. Right ventricular systolic function is normal. The right ventricular  size is normal. Tricuspid regurgitation signal is inadequate for assessing  PA pressure.   3. The mitral valve is normal in structure. No evidence of mitral valve  regurgitation. No evidence of mitral stenosis.   4. The aortic valve was not well visualized. Aortic valve regurgitation  is not visualized. No aortic stenosis is present.   5. The inferior vena cava is normal in size with greater than 50%  respiratory variability, suggesting right atrial pressure of 3 mmHg.  EKG:  EKG not repeated.  Most recent EKG was performed on 12/11/2020 demonstrating left axis deviation with poor R wave progression.  Recent Labs: 12/06/2020: ALT 17 12/07/2020: BUN 12; Creatinine, Ser 0.65; Potassium 3.7; Sodium 133 12/08/2020: Hemoglobin 13.2; Magnesium 1.9; Platelets 238  Recent Lipid Panel    Component Value Date/Time   CHOL 218 (H) 12/06/2020 2129   TRIG 118 12/06/2020 2129   HDL 44 12/06/2020 2129   CHOLHDL 5.0 12/06/2020 2129   VLDL 24 12/06/2020 2129   LDLCALC 150 (H) 12/06/2020 2129    Physical Exam:    VS:  BP 122/74   Pulse 71   Ht 5\' 5"  (1.651 m)   Wt 203 lb (92.1 kg)   SpO2 98%   BMI 33.78 kg/m     Wt Readings from Last 3 Encounters:  12/21/20 203 lb (92.1 kg)  12/07/20 201 lb 8 oz (91.4 kg)     GEN: Overweight. No acute distress HEENT: Normal NECK: No JVD. LYMPHATICS: No lymphadenopathy CARDIAC: No murmur. RRR no gallop, or edema. VASCULAR:  Normal Pulses. No bruits. RESPIRATORY:  Clear to auscultation without rales, wheezing or rhonchi  ABDOMEN: Soft, non-tender, non-distended, No pulsatile  mass, MUSCULOSKELETAL: No deformity  SKIN: Warm and dry NEUROLOGIC:  Alert and oriented x 3 PSYCHIATRIC:  Normal affect   ASSESSMENT:    1. ST elevation myocardial infarction (STEMI) of lateral wall (HCC)   2. Hyperlipidemia with target LDL less than 70   3. NSVT (nonsustained ventricular tachycardia) (HCC)   4. Uncontrolled type 2 diabetes mellitus with hyperglycemia (HCC)    PLAN:    In order of problems listed above:  Resolution of ST elevation and symptoms with balloon angioplasty of relatively small first diagonal.  No recurrence of discomfort since then.  Stenosis in the diagonal went from 100% to 80%.  Did not feel comfortable doing aggressive balloon dilatation because of the vessel diameter size.  Secondary prevention discussed in detail.  Phase 2  cardiac rehab is greatly needed. LDL initially greater than 150.  Now on max dose rosuvastatin and Zetia.  Check liver and lipid panel and 1 month. No recurrence. Marcelline Deist was added to her medication regimen along with Glucophage.  She will see Dr. Rogelia Boga next few weeks.  Overall education and awareness concerning secondary risk prevention was discussed in detail: LDL less than 70, hemoglobin A1c less than 7, blood pressure target less than 130/80 mmHg, >150 minutes of moderate aerobic activity per week, avoidance of smoking, weight control (via diet and exercise), and continued surveillance/management of/for obstructive sleep apnea.    Medication Adjustments/Labs and Tests Ordered: Current medicines are reviewed at length with the patient today.  Concerns regarding medicines are outlined above.  Orders Placed This Encounter  Procedures   Lipid panel   Hepatic function panel   No orders of the defined types were placed in this encounter.   Patient Instructions  Medication Instructions:  Your physician recommends that you continue on your current medications as directed. Please refer to the Current Medication list given to  you today.  *If you need a refill on your cardiac medications before your next appointment, please call your pharmacy*   Lab Work: Lipid and Liver at the end of September.  You will need to be fasting for these labs (nothing to eat or drink after midnight except water and black coffee).  If you have labs (blood work) drawn today and your tests are completely normal, you will receive your results only by: MyChart Message (if you have MyChart) OR A paper copy in the mail If you have any lab test that is abnormal or we need to change your treatment, we will call you to review the results.   Testing/Procedures: None   Follow-Up: At [facility], you and your health needs are our priority.  As part of our continuing mission to provide you with exceptional heart care, we have created designated Provider Care Teams.  These Care Teams include your primary Cardiologist (physician) and Advanced Practice Providers (APPs -  Physician Assistants and Nurse Practitioners) who all work together to provide you with the care you need, when you need it.  We recommend signing up for the patient portal called "MyChart".  Sign up information is provided on this After Visit Summary.  MyChart is used to connect with patients for Virtual Visits (Telemedicine).  Patients are able to view lab/test results, encounter notes, upcoming appointments, etc.  Non-urgent messages can be sent to your provider as well.   To learn more about what you can do with MyChart, go to [email or web address].    Your next appointment:   4-5 month(s)  The format for your next appointment:   In Person  Provider:   You may see Lesleigh Noe, MD or one of the following Advanced Practice Providers on your designated Care Team:   Nada Boozer, NP   Other Instructions   Please contact Cardiac Rehab to get scheduled.  They have the referral already.  [facility] [phone] [facility] [phone]   Signed, Lesleigh Noe, MD   12/21/2020 12:42 PM    [facility]

## 2020-12-21 ENCOUNTER — Encounter: Payer: Self-pay | Admitting: Interventional Cardiology

## 2020-12-21 ENCOUNTER — Other Ambulatory Visit: Payer: Self-pay

## 2020-12-21 ENCOUNTER — Ambulatory Visit: Payer: BC Managed Care – PPO | Admitting: Interventional Cardiology

## 2020-12-21 VITALS — BP 122/74 | HR 71 | Ht 65.0 in | Wt 203.0 lb

## 2020-12-21 DIAGNOSIS — E785 Hyperlipidemia, unspecified: Secondary | ICD-10-CM | POA: Diagnosis not present

## 2020-12-21 DIAGNOSIS — I4729 Other ventricular tachycardia: Secondary | ICD-10-CM

## 2020-12-21 DIAGNOSIS — I472 Ventricular tachycardia: Secondary | ICD-10-CM | POA: Diagnosis not present

## 2020-12-21 DIAGNOSIS — E1165 Type 2 diabetes mellitus with hyperglycemia: Secondary | ICD-10-CM | POA: Diagnosis not present

## 2020-12-21 DIAGNOSIS — I2129 ST elevation (STEMI) myocardial infarction involving other sites: Secondary | ICD-10-CM

## 2020-12-21 NOTE — Patient Instructions (Signed)
Medication Instructions:  Your physician recommends that you continue on your current medications as directed. Please refer to the Current Medication list given to you today.  *If you need a refill on your cardiac medications before your next appointment, please call your pharmacy*   Lab Work: Lipid and Liver at the end of September.  You will need to be fasting for these labs (nothing to eat or drink after midnight except water and black coffee).  If you have labs (blood work) drawn today and your tests are completely normal, you will receive your results only by: MyChart Message (if you have MyChart) OR A paper copy in the mail If you have any lab test that is abnormal or we need to change your treatment, we will call you to review the results.   Testing/Procedures: None   Follow-Up: At [facility], you and your health needs are our priority.  As part of our continuing mission to provide you with exceptional heart care, we have created designated Provider Care Teams.  These Care Teams include your primary Cardiologist (physician) and Advanced Practice Providers (APPs -  Physician Assistants and Nurse Practitioners) who all work together to provide you with the care you need, when you need it.  We recommend signing up for the patient portal called "MyChart".  Sign up information is provided on this After Visit Summary.  MyChart is used to connect with patients for Virtual Visits (Telemedicine).  Patients are able to view lab/test results, encounter notes, upcoming appointments, etc.  Non-urgent messages can be sent to your provider as well.   To learn more about what you can do with MyChart, go to [email or web address].    Your next appointment:   4-5 month(s)  The format for your next appointment:   In Person  Provider:   You may see Lesleigh Noe, MD or one of the following Advanced Practice Providers on your designated Care Team:   Nada Boozer, NP   Other  Instructions   Please contact Cardiac Rehab to get scheduled.  They have the referral already.  [facility] [phone] [facility] [phone]

## 2021-01-16 ENCOUNTER — Other Ambulatory Visit: Payer: BC Managed Care – PPO

## 2021-01-18 ENCOUNTER — Telehealth: Payer: Self-pay | Admitting: Interventional Cardiology

## 2021-01-18 NOTE — Telephone Encounter (Signed)
Patient missed her lab work on 09/27 and initially called to reschedule. However, a new orders are needed. She also states she may have lab work with her PCP in October. If so, will she still need to have labs at our office? Please advise.

## 2021-01-18 NOTE — Telephone Encounter (Signed)
Pt seeing PCP on Tuesday and they typically do full work up of labs.  Advised to have them send [place] a copy and if they don't get what we need, then we can get her scheduled to come into our office.  Pt in agreement with plan.

## 2021-01-25 NOTE — Telephone Encounter (Signed)
Pt was to have Lipid and Liver in our office at the end of Sept but missed appt.  She had these labs drawn at PCP on 10/4.  Will send to Dr. Katrinka Blazing to review.

## 2021-02-21 ENCOUNTER — Other Ambulatory Visit (HOSPITAL_COMMUNITY): Payer: Self-pay

## 2021-06-02 NOTE — Progress Notes (Signed)
Cardiology Office Note:    Date:  06/04/2021   ID:  Jessica Maddox, DOB April 08, 1971, MRN [id number]  PCP:  Marisue Ivan, MD  Cardiologist:  Lesleigh Noe, MD   Referring MD: Marisue Ivan, MD   Chief Complaint  Patient presents with   Coronary Artery Disease    History of Present Illness:    Jessica Maddox is a 51 y.o. 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   There have been no recurring episodes of angina.  She is thinking in a more preventative mindset.  She has changed her diet.  This is brought about some weight loss.  Most recent hemoglobin A1c was 10.  This is down from 14.  Past Medical History:  Diagnosis Date   Diabetes mellitus without complication [facility])     Past Surgical History:  Procedure Laterality Date   CORONARY/GRAFT ACUTE MI REVASCULARIZATION N/A 12/06/2020   Procedure: Coronary/Graft Acute MI Revascularization;  Surgeon: Lyn Records, MD;  Location: [facility]VAS[facility] CV LAB;  Service: Cardiovascular;  Laterality: N/A;   LEFT HEART CATH AND CORONARY ANGIOGRAPHY N/A 12/06/2020   Procedure: LEFT HEART CATH AND CORONARY ANGIOGRAPHY;  Surgeon: Lyn Records, MD;  Location: [facility] INVASIVE CV LAB;  Service: Cardiovascular;  Laterality: N/A;   TUBAL LIGATION      Current Medications: Current Meds  Medication Sig   aspirin EC 81 MG tablet Take 81 mg by mouth daily. Swallow whole.   clopidogrel (PLAVIX) 75 MG tablet Take 1 tablet (75 mg total) by mouth daily with breakfast.   dapagliflozin propanediol (FARXIGA) 10 MG TABS tablet Take 1 tablet (10 mg total) by mouth daily before breakfast.   ezetimibe (ZETIA) 10 MG tablet Take 1 tablet (10 mg total) by mouth daily.   insulin aspart (NOVOLOG) 100 UNIT/ML injection Inject 7 Units into the skin 3  (three) times daily before meals.   insulin glargine (LANTUS) 100 UNIT/ML injection Inject 20 Units into the skin at bedtime.   metFORMIN (GLUCOPHAGE) 500 MG tablet Take 1 tablet (500 mg total) by mouth 2 (two) times daily with a meal.   metoprolol succinate (TOPROL-XL) 50 MG 24 hr tablet Take 1 tablet (50 mg total) by mouth daily. Take with or immediately following a meal.   nitroGLYCERIN (NITROSTAT) 0.4 MG SL tablet Place 1 tablet (0.4 mg total) under the tongue every 5 (five) minutes as needed for chest pain.   rosuvastatin (CRESTOR) 40 MG tablet Take 40 mg by mouth daily. Pt takes 1tablet 40 mg once a day     Allergies:   Iodine, Shellfish allergy, and Metformin   Social History   Socioeconomic History   Marital status: Single    Spouse name: Not on file   Number of children: Not on file   Years of education: Not on file   Highest education level: Not on file  Occupational History   Not on file  Tobacco Use   Smoking status: Never   Smokeless tobacco: Never  Substance and Sexual Activity   Alcohol use: Not on file   Drug use: Yes   Sexual activity: Not on file  Other Topics Concern   Not on file  Social History Narrative   Not on file   Social Determinants of Health   Financial Resource Strain: Not on file  Food Insecurity: Not on file  Transportation Needs: Not on file  Physical Activity: Not on file  Stress: Not on file  Social Connections: Not on file     Family History: The patient's family history is not on file.  ROS:   Please see the history of present illness.    No angina or medication side effects.  Wants to know how long she will need to be on dual antiplatelet therapy.  The answer is 12 months then followed by monotherapy, possibly clopidogrel rather than aspirin.  All other systems reviewed and are negative.  EKGs/Labs/Other Studies Reviewed:    The following studies were reviewed today:  Cholesterol October 2022: Total cholesterol 66, HDL 29, LDL  26.  Coronary angiography and PCI 11/2020: Intervention    EKG:  EKG not repeated  Recent Labs: 12/06/2020: ALT 17 12/07/2020: BUN 12; Creatinine, Ser 0.65; Potassium 3.7; Sodium 133 12/08/2020: Hemoglobin 13.2; Magnesium 1.9; Platelets 238  Recent Lipid Panel    Component Value Date/Time   CHOL 218 (H) 12/06/2020 2129   TRIG 118 12/06/2020 2129   HDL 44 12/06/2020 2129   CHOLHDL 5.0 12/06/2020 2129   VLDL 24 12/06/2020 2129   LDLCALC 150 (H) 12/06/2020 2129    Physical Exam:    VS:  BP 122/78    Pulse 68    Ht 5\' 5"  (1.651 m)    Wt 191 lb 3.2 oz (86.7 kg)    SpO2 97%    BMI 31.82 kg/m     Wt Readings from Last 3 Encounters:  06/04/21 191 lb 3.2 oz (86.7 kg)  12/21/20 203 lb (92.1 kg)  12/07/20 201 lb 8 oz (91.4 kg)     GEN: Mildly overweight. No acute distress HEENT: Normal NECK: No JVD. LYMPHATICS: No lymphadenopathy CARDIAC: No murmur. RRR no gallop, or edema. VASCULAR:  Normal Pulses. No bruits. RESPIRATORY:  Clear to auscultation without rales, wheezing or rhonchi  ABDOMEN: Soft, non-tender, non-distended, No pulsatile mass, MUSCULOSKELETAL: No deformity  SKIN: Warm and dry NEUROLOGIC:  Alert and oriented x 3 PSYCHIATRIC:  Normal affect   ASSESSMENT:    1. ST elevation myocardial infarction (STEMI) of lateral wall (HCC)   2. Hyperlipidemia with target LDL less than 70   3. Uncontrolled type 2 diabetes mellitus with hyperglycemia (HCC)   4. Primary hypertension    PLAN:    In order of problems listed above:  No recurrence of angina.  First diagonal was treated with angioplasty.  Continue dual antiplatelet therapy for 12 months total.  Decrease to monotherapy probably with clopidogrel 75 mg/day. The most recent LDL cholesterol performed in October was less than 30. A1c still needs control.  I encouraged continued weight loss.  She is down 12 pounds compared to prior visit.  Continue Glucophage, Lantus, and Farxiga. Excellent blood pressure control on  current therapy which includes Toprol-XL 50 mg/day.   Overall education and awareness concerning primary/secondary risk prevention was discussed in detail: LDL less than 70, hemoglobin A1c less than 7, blood pressure target less than 130/80 mmHg, >150 minutes of moderate aerobic activity per week, avoidance of smoking, weight control (via diet and exercise), and continued surveillance/management of/for obstructive sleep apnea.    Medication Adjustments/Labs and Tests Ordered: Current medicines are reviewed at length with the patient today.  Concerns regarding medicines are outlined above.  No orders of the defined types were placed in this encounter.  No orders of the defined types were placed in this encounter.   Patient  Instructions  Medication Instructions:  Your physician recommends that you continue on your current medications as directed. Please refer to the Current Medication list given to you today.  *If you need a refill on your cardiac medications before your next appointment, please call your pharmacy*   Follow-Up: At [facility], you and your health needs are our priority.  As part of our continuing mission to provide you with exceptional heart care, we have created designated Provider Care Teams.  These Care Teams include your primary Cardiologist (physician) and Advanced Practice Providers (APPs -  Physician Assistants and Nurse Practitioners) who all work together to provide you with the care you need, when you need it.   Your next appointment:   6 month(s)  The format for your next appointment:   In Person  Provider:   Lesleigh Noe, MD      Signed, Lesleigh Noe, MD  06/04/2021 9:12 AM    [facility]

## 2021-06-04 ENCOUNTER — Other Ambulatory Visit: Payer: Self-pay

## 2021-06-04 ENCOUNTER — Ambulatory Visit (INDEPENDENT_AMBULATORY_CARE_PROVIDER_SITE_OTHER): Payer: No Typology Code available for payment source | Admitting: Interventional Cardiology

## 2021-06-04 ENCOUNTER — Encounter: Payer: Self-pay | Admitting: Interventional Cardiology

## 2021-06-04 VITALS — BP 122/78 | HR 68 | Ht 65.0 in | Wt 191.2 lb

## 2021-06-04 DIAGNOSIS — E1165 Type 2 diabetes mellitus with hyperglycemia: Secondary | ICD-10-CM

## 2021-06-04 DIAGNOSIS — I10 Essential (primary) hypertension: Secondary | ICD-10-CM

## 2021-06-04 DIAGNOSIS — E785 Hyperlipidemia, unspecified: Secondary | ICD-10-CM | POA: Diagnosis not present

## 2021-06-04 DIAGNOSIS — I2129 ST elevation (STEMI) myocardial infarction involving other sites: Secondary | ICD-10-CM

## 2021-06-04 NOTE — Patient Instructions (Signed)
Medication Instructions:  Your physician recommends that you continue on your current medications as directed. Please refer to the Current Medication list given to you today.  *If you need a refill on your cardiac medications before your next appointment, please call your pharmacy*    Follow-Up: At [facility], you and your health needs are our priority.  As part of our continuing mission to provide you with exceptional heart care, we have created designated Provider Care Teams.  These Care Teams include your primary Cardiologist (physician) and Advanced Practice Providers (APPs -  Physician Assistants and Nurse Practitioners) who all work together to provide you with the care you need, when you need it.  Your next appointment:   6 month(s)  The format for your next appointment:   In Person  Provider:   Henry W Smith III, MD     

## 2021-10-14 ENCOUNTER — Other Ambulatory Visit: Payer: Self-pay | Admitting: Interventional Cardiology

## 2021-11-24 NOTE — Progress Notes (Signed)
Cardiology Office Note:    Date:  11/26/2021   ID:  Jessica Maddox, DOB [birth year], MRN [id number]  PCP:  Marisue Ivan, MD  Cardiologist:  Lesleigh Noe, MD   Referring MD: Marisue Ivan, MD   Chief Complaint  Patient presents with   Coronary Artery Disease    History of Present Illness:    Jessica Maddox is a 51 y.o. 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 Normal systolic and diastolic function echo 11/2020.   No episodes of angina pectoris.  Fully engaged at work.  No medication side effects.  Will be seeing an endocrinologist for better diabetes control.  Denies orthopnea, PND, and peripheral edema.  No palpitations or neurological complaints.  Past Medical History:  Diagnosis Date   Diabetes mellitus without complication [facility])     Past Surgical History:  Procedure Laterality Date   CORONARY/GRAFT ACUTE MI REVASCULARIZATION N/A 12/06/2020   Procedure: Coronary/Graft Acute MI Revascularization;  Surgeon: Lyn Records, MD;  Location: [facility] INVASIVE CV LAB;  Service: Cardiovascular;  Laterality: N/A;   LEFT HEART CATH AND CORONARY ANGIOGRAPHY N/A 12/06/2020   Procedure: LEFT HEART CATH AND CORONARY ANGIOGRAPHY;  Surgeon: Lyn Records, MD;  Location: [facility] INVASIVE CV LAB;  Service: Cardiovascular;  Laterality: N/A;   TUBAL LIGATION      Current Medications: Current Meds  Medication Sig   aspirin EC 81 MG tablet Take 81 mg by mouth daily. Swallow whole.   clopidogrel (PLAVIX) 75 MG tablet Take 1 tablet by mouth once daily with breakfast   Continuous Blood Gluc Receiver (DEXCOM G7 RECEIVER) DEVI as directed.   Continuous Blood Gluc Sensor (DEXCOM G7 SENSOR) MISC as directed.   dapagliflozin propanediol (FARXIGA) 10 MG TABS tablet Take 1 tablet  (10 mg total) by mouth daily before breakfast.   ezetimibe (ZETIA) 10 MG tablet Take 1 tablet by mouth once daily   insulin aspart (NOVOLOG) 100 UNIT/ML injection Inject 7 Units into the skin 3 (three) times daily before meals.   insulin glargine (LANTUS) 100 UNIT/ML injection Inject 20 Units into the skin at bedtime.   metFORMIN (GLUCOPHAGE) 500 MG tablet Take 1 tablet (500 mg total) by mouth 2 (two) times daily with a meal.   metoprolol succinate (TOPROL-XL) 50 MG 24 hr tablet Take 1 tablet (50 mg total) by mouth daily. Take with or immediately following a meal.   nitroGLYCERIN (NITROSTAT) 0.4 MG SL tablet Place 1 tablet (0.4 mg total) under the tongue every 5 (five) minutes as needed for chest pain.   rosuvastatin (CRESTOR) 40 MG tablet Take 40 mg by mouth daily. Pt takes 1tablet 40 mg once a day   TRULICITY 0.75 MG/0.5ML SOPN Inject into the skin once a week.     Allergies:   Iodine and Shellfish allergy   Social History   Socioeconomic History   Marital status: Single    Spouse name: Not on file   Number of children: Not on file   Years of education: Not on file   Highest education level: Not on file  Occupational History   Not on file  Tobacco Use   Smoking status: Never   Smokeless tobacco: Never  Substance and Sexual Activity   Alcohol use: Not on file   Drug use: Yes   Sexual activity: Not  on file  Other Topics Concern   Not on file  Social History Narrative   Not on file   Social Determinants of Health   Financial Resource Strain: Not on file  Food Insecurity: Not on file  Transportation Needs: Not on file  Physical Activity: Not on file  Stress: Not on file  Social Connections: Not on file     Family History: The patient's family history is not on file.  ROS:   Please see the history of present illness.    Promotion at work.  She is now a Production designer, theatre/television/film.  Still gets physical activity.  All other systems reviewed and are negative.  EKGs/Labs/Other Studies  Reviewed:    The following studies were reviewed today: No new imaging.  June 2023 pertinent laboratory data: Hemoglobin A1c 13.3 LDL cholesterol 24 are Total cholesterol 65  EKG:  EKG normal sinus rhythm, left anterior hemiblock, superior axis, left atrial abnormality, low voltage.  Compared to the prior tracing from August 2022, lateral ST elevation has resolved.  Recent Labs: 12/06/2020: ALT 17 12/07/2020: BUN 12; Creatinine, Ser 0.65; Potassium 3.7; Sodium 133 12/08/2020: Hemoglobin 13.2; Magnesium 1.9; Platelets 238  Recent Lipid Panel    Component Value Date/Time   CHOL 218 (H) 12/06/2020 2129   TRIG 118 12/06/2020 2129   HDL 44 12/06/2020 2129   CHOLHDL 5.0 12/06/2020 2129   VLDL 24 12/06/2020 2129   LDLCALC 150 (H) 12/06/2020 2129    Physical Exam:    VS:  BP 132/82   Pulse 77   Ht 5\' 5"  (1.651 m)   Wt 182 lb 9.6 oz (82.8 kg)   SpO2 96%   BMI 30.39 kg/m     Wt Readings from Last 3 Encounters:  11/26/21 182 lb 9.6 oz (82.8 kg)  06/04/21 191 lb 3.2 oz (86.7 kg)  12/21/20 203 lb (92.1 kg)     GEN: Weight is down 20 pounds since her myocardial infarction.. No acute distress HEENT: Normal NECK: No JVD. LYMPHATICS: No lymphadenopathy CARDIAC: No murmur. RRR no gallop, or edema. VASCULAR:  Normal Pulses. No bruits. RESPIRATORY:  Clear to auscultation without rales, wheezing or rhonchi  ABDOMEN: Soft, non-tender, non-distended, No pulsatile mass, MUSCULOSKELETAL: No deformity  SKIN: Warm and dry NEUROLOGIC:  Alert and oriented x 3 PSYCHIATRIC:  Normal affect   ASSESSMENT:    1. Coronary artery disease of native artery of native heart with stable angina pectoris (HCC)   2. Hyperlipidemia with target LDL less than 70   3. Primary hypertension   4. NSVT (nonsustained ventricular tachycardia) (HCC)   5. Uncontrolled type 2 diabetes mellitus with hyperglycemia (HCC)    PLAN:    In order of problems listed above:  Overall education and awareness concerning  primary/secondary risk prevention was discussed in detail: LDL less than 70, hemoglobin A1c less than 7, blood pressure target less than 130/80 mmHg, >150 minutes of moderate aerobic activity per week, avoidance of smoking, weight control (via diet and exercise), and continued surveillance/management of/for obstructive sleep apnea.  No nitroglycerin use.  Lipid therapy adjustment can be done.  Recommend decreasing rosuvastatin to 20 mg/day and continuing Zetia.  See #2 below. Continue aggressive lipid-lowering.  May be able to discontinue Zetia.  Although recent data suggests that Zetia plus moderate dose rosuvastatin may be better than high-dose rosuvastatin alone.  Therefore decreasing the dose of rosuvastatin believing Zetia makes the most sense. Continue Toprol-XL No palpitations or issues. Will be seeing an endocrinologist in [place] at [facility].   Establish with new cardiologist in 1 year upon return.   Medication Adjustments/Labs and Tests Ordered: Current medicines are reviewed at length with the patient today.  Concerns regarding medicines are outlined above.  Orders Placed This Encounter  Procedures   EKG 12-Lead   No orders of the defined types were placed in this encounter.   Patient Instructions  Medication Instructions:  Your physician recommends that you continue on your current medications as directed. Please refer to the Current Medication list given to you today.  *If you need a refill on your cardiac medications before your next appointment, please call your pharmacy*  Lab Work: NONE  Testing/Procedures: NONE  Follow-Up: At [facility], you and your health needs are our priority.  As part of our continuing mission to provide you with exceptional heart care, we have created designated Provider Care Teams.  These Care Teams include your primary Cardiologist (physician) and Advanced Practice Providers (APPs -  Physician Assistants and Nurse Practitioners)  who all work together to provide you with the care you need, when you need it.  Your next appointment:   1 year(s)  The format for your next appointment:   In Person  Provider:   Lesleigh Noe, MD {   Important Information About Sugar         Signed, Lesleigh Noe, MD  11/26/2021 9:34 AM    [facility]

## 2021-11-26 ENCOUNTER — Encounter: Payer: Self-pay | Admitting: Interventional Cardiology

## 2021-11-26 ENCOUNTER — Ambulatory Visit (INDEPENDENT_AMBULATORY_CARE_PROVIDER_SITE_OTHER): Payer: No Typology Code available for payment source | Admitting: Interventional Cardiology

## 2021-11-26 VITALS — BP 132/82 | HR 77 | Ht 65.0 in | Wt 182.6 lb

## 2021-11-26 DIAGNOSIS — I25118 Atherosclerotic heart disease of native coronary artery with other forms of angina pectoris: Secondary | ICD-10-CM | POA: Diagnosis not present

## 2021-11-26 DIAGNOSIS — I4729 Other ventricular tachycardia: Secondary | ICD-10-CM | POA: Diagnosis not present

## 2021-11-26 DIAGNOSIS — E1165 Type 2 diabetes mellitus with hyperglycemia: Secondary | ICD-10-CM

## 2021-11-26 DIAGNOSIS — E785 Hyperlipidemia, unspecified: Secondary | ICD-10-CM

## 2021-11-26 DIAGNOSIS — I10 Essential (primary) hypertension: Secondary | ICD-10-CM

## 2021-11-26 NOTE — Patient Instructions (Signed)

## 2022-01-23 ENCOUNTER — Other Ambulatory Visit: Payer: Self-pay | Admitting: Interventional Cardiology

## 2022-01-24 MED ORDER — DAPAGLIFLOZIN PROPANEDIOL 10 MG PO TABS: 10.0000 mg | ORAL_TABLET | Freq: Every day | ORAL | 3 refills | Status: AC

## 2022-03-20 ENCOUNTER — Telehealth: Payer: Self-pay | Admitting: Interventional Cardiology

## 2022-03-20 NOTE — Telephone Encounter (Signed)
Pt c/o medication issue:  1. Name of Medication:   clopidogrel (PLAVIX) 75 MG tablet    2. How are you currently taking this medication (dosage and times per day)?   Take 1 tablet by mouth once daily with breakfast    3. Are you having a reaction (difficulty breathing--STAT)? No  4. What is your medication issue? Pt would like a callback regarding whether she is to continue taking medication or not

## 2022-03-20 NOTE — Telephone Encounter (Signed)
Returned call to patient. She would like to know if she will need to continue taking Plavix. She states at last office visit she discussed this with Dr. Katrinka Blazing and states he told her she could possibly come off of the Plavix.  Per office visit note from 06/04/2021: Continue dual antiplatelet therapy for 12 months total. Decrease to monotherapy probably with clopidogrel 75 mg/day.   No discussion noted in office visit note on 11/26/2021.  Patient states she is planning to have a colonoscopy performed next year and had her consultation today where they discussed increased risk of bleeding while taking Plavix.  Will forward to Dr. Katrinka Blazing to review and advise.

## 2022-03-21 ENCOUNTER — Other Ambulatory Visit: Payer: Self-pay | Admitting: Interventional Cardiology

## 2022-03-21 NOTE — Telephone Encounter (Signed)
...     Pre-operative Risk Assessment    Patient Name: Jessica Maddox  DOB: Oct 20, 1970 MRN: [id number]      Request for Surgical Clearance    Procedure:   colonoscopy  Date of Surgery:  Clearance 07/24/22                                 Surgeon:  Loreli Slot Surgeon's Group or Practice Name:  [facility] Phone number:  [phone] Fax number:  [phone]   Type of Clearance Requested:   - Medical  - Pharmacy:  Hold Aspirin and Clopidogrel (Plavix)     Type of Anesthesia:  Not Indicated   Additional requests/questions:    Jola Babinski   03/21/2022, 12:34 PM

## 2022-03-25 NOTE — Telephone Encounter (Signed)
Covering preop today. Dr. Katrinka Blazing, the colonoscopy is not until 07/2022. We would typically do a tele visit closer to that time beforehand since medical clearance is being requested as well. Did you want the patient to stop Plavix and continue ASA at least MWF now, or enact these changes when we do her tele visit prior to colonoscopy in April 2024? - Please route response to P CV DIV PREOP (the pre-op pool). Thank you.

## 2022-03-26 ENCOUNTER — Telehealth: Payer: Self-pay | Admitting: *Deleted

## 2022-03-26 NOTE — Telephone Encounter (Signed)
I s/w the pt per Ronie Spies, PAC pre op app today who recommended tele pre op appt this week to d/w the pt the recommendations from Dr. Katrinka Blazing about Plavix and ASA. Med rec and consent are done.     Patient Consent for Virtual Visit        Jennell Janosik has provided verbal consent on 03/26/2022 for a virtual visit (video or telephone).   CONSENT FOR VIRTUAL VISIT FOR:  Jessica Maddox  By participating in this virtual visit I agree to the following:  I hereby voluntarily request, consent and authorize [facility] and its employed or contracted physicians, physician assistants, nurse practitioners or other licensed health care professionals (the Practitioner), to provide me with telemedicine health care services (the "Services") as deemed necessary by the treating Practitioner. I acknowledge and consent to receive the Services by the Practitioner via telemedicine. I understand that the telemedicine visit will involve communicating with the Practitioner through live audiovisual communication technology and the disclosure of certain medical information by electronic transmission. I acknowledge that I have been given the opportunity to request an in-person assessment or other available alternative prior to the telemedicine visit and am voluntarily participating in the telemedicine visit.  I understand that I have the right to withhold or withdraw my consent to the use of telemedicine in the course of my care at any time, without affecting my right to future care or treatment, and that the Practitioner or I may terminate the telemedicine visit at any time. I understand that I have the right to inspect all information obtained and/or recorded in the course of the telemedicine visit and may receive copies of available information for a reasonable fee.  I understand that some of the potential risks of receiving the Services via telemedicine include:  Delay or interruption in medical evaluation  due to technological equipment failure or disruption; Information transmitted may not be sufficient (e.g. poor resolution of images) to allow for appropriate medical decision making by the Practitioner; and/or  In rare instances, security protocols could fail, causing a breach of personal health information.  Furthermore, I acknowledge that it is my responsibility to provide information about my medical history, conditions and care that is complete and accurate to the best of my ability. I acknowledge that Practitioner's advice, recommendations, and/or decision may be based on factors not within their control, such as incomplete or inaccurate data provided by me or distortions of diagnostic images or specimens that may result from electronic transmissions. I understand that the practice of medicine is not an exact science and that Practitioner makes no warranties or guarantees regarding treatment outcomes. I acknowledge that a copy of this consent can be made available to me via my patient portal [facility] MyChart), or I can request a printed copy by calling the office of [facility].    I understand that my insurance will be billed for this visit.   I have read or had this consent read to me. I understand the contents of this consent, which adequately explains the benefits and risks of the Services being provided via telemedicine.  I have been provided ample opportunity to ask questions regarding this consent and the Services and have had my questions answered to my satisfaction. I give my informed consent for the services to be provided through the use of telemedicine in my medical care

## 2022-03-26 NOTE — Telephone Encounter (Signed)
I s/w the pt per Jessica Maddox, PAC pre op app today who recommended tele pre op appt this week to d/w the pt the recommendations from Dr. Katrinka Blazing about Plavix and ASA. Med rec and consent are done.

## 2022-03-26 NOTE — Telephone Encounter (Signed)
   Name: Jessica Maddox  DOB: [birth year]  MRN: [id number]  Primary Cardiologist: Lesleigh Noe, MD   Preoperative team, please contact this patient and set up a phone call appointment for further preoperative risk assessment. Please obtain consent and complete medication review. Thank you for your help.   I confirm that guidance regarding antiplatelet and oral anticoagulation therapy has been completed and, if necessary, noted below. Our preop team can relay Dr. Michaelle Copas recommendations for blood thinner adjustment during that call. Would try to schedule this week so we can relay this recommendation to the patient even though procedure is not until April (she will be asked to notify if any changes between now and procedure date per protocol).   Laurann Montana, PA-C 03/26/2022, 7:40 AM [facility]

## 2022-03-27 ENCOUNTER — Ambulatory Visit: Payer: No Typology Code available for payment source | Attending: Cardiology | Admitting: Nurse Practitioner

## 2022-03-27 DIAGNOSIS — Z01810 Encounter for preprocedural cardiovascular examination: Secondary | ICD-10-CM

## 2022-03-27 NOTE — Progress Notes (Signed)
Virtual Visit via Telephone Note   Because of Jessica Maddox's co-morbid illnesses, she is at least at moderate risk for complications without adequate follow up.  This format is felt to be most appropriate for this patient at this time.  The patient did not have access to video technology/had technical difficulties with video requiring transitioning to audio format only (telephone).  All issues noted in this document were discussed and addressed.  No physical exam could be performed with this format.  Please refer to the patient's chart for her consent to telehealth for [facility].  Evaluation Performed:  Preoperative cardiovascular risk assessment _____________   Date:  03/27/2022   Patient ID:  Jessica Maddox, DOB 1970-10-27, MRN [id number] Patient Location:  Home Provider location:   Office  Primary Care Provider:  Marisue Ivan, MD Primary Cardiologist:  Lesleigh Noe, MD  Chief Complaint / Patient Profile   51 y.o. y/o female with a h/o CAD s/p PTCA-D1 in 11/2020, NSVT, hypertension, hyperlipidemia, and type 2 diabetes who is pending colonoscopy on 07/24/2022 at [facility] and presents today for telephonic preoperative cardiovascular risk assessment.  History of Present Illness    Jessica Maddox is a 51 y.o. female who presents via audio/video conferencing for a telehealth visit today.  Pt was last seen in cardiology clinic on 11/26/2021 by Dr. Katrinka Blazing.  At that time Jessica Maddox was doing well.  The patient is now pending procedure as outlined above. Since her last visit, she has done well from a cardiac standpoint.   She denies chest pain, palpitations, dyspnea, pnd, orthopnea, n, v, dizziness, syncope, edema, weight gain, or early satiety. All other systems reviewed and are otherwise negative except as noted above.   Past Medical History    Past Medical History:  Diagnosis Date   Diabetes mellitus without complication [facility])     Past Surgical History:  Procedure Laterality Date   CORONARY/GRAFT ACUTE MI REVASCULARIZATION N/A 12/06/2020   Procedure: Coronary/Graft Acute MI Revascularization;  Surgeon: Lyn Records, MD;  Location: [facility] INVASIVE CV LAB;  Service: Cardiovascular;  Laterality: N/A;   LEFT HEART CATH AND CORONARY ANGIOGRAPHY N/A 12/06/2020   Procedure: LEFT HEART CATH AND CORONARY ANGIOGRAPHY;  Surgeon: Lyn Records, MD;  Location: [facility] INVASIVE CV LAB;  Service: Cardiovascular;  Laterality: N/A;   TUBAL LIGATION      Allergies  Allergies  Allergen Reactions   Iodine Anaphylaxis   Shellfish Allergy Anaphylaxis    Home Medications    Prior to Admission medications   Medication Sig Start Date End Date Taking? Authorizing Provider  aspirin EC 81 MG tablet Take 81 mg by mouth daily. Swallow whole.    [provider]  clopidogrel (PLAVIX) 75 MG tablet Take 1 tablet by mouth once daily with breakfast 10/16/21   Lyn Records, MD  Continuous Blood Gluc Receiver (DEXCOM G7 RECEIVER) DEVI as directed. 11/21/21   [provider]  Continuous Blood Gluc Sensor (DEXCOM G7 SENSOR) MISC as directed. 11/21/21   [provider]  dapagliflozin propanediol (FARXIGA) 10 MG TABS tablet Take 1 tablet (10 mg total) by mouth daily before breakfast. 01/24/22   Lyn Records, MD  ezetimibe (ZETIA) 10 MG tablet Take 1 tablet by mouth once daily 03/21/22   Lyn Records, MD  insulin aspart (NOVOLOG) 100 UNIT/ML injection Inject 7 Units into the skin 3 (three) times daily before meals. Patient not taking: Reported on 03/26/2022    [provider]  insulin glargine (LANTUS) 100 UNIT/ML injection Inject 20 Units into the skin at bedtime. Patient not taking: Reported on 03/26/2022    [provider]  metFORMIN (GLUCOPHAGE) 500 MG tablet Take 1 tablet (500 mg total) by mouth 2 (two) times daily with a meal. 12/09/20 03/26/22  Lyn Records, MD  metoprolol succinate (TOPROL-XL) 50 MG 24 hr  tablet TAKE 1 TABLET BY MOUTH ONCE DAILY. TAKE WITH OR IMMEDIATELY FOLLOWING A MEAL 01/24/22   Lyn Records, MD  nitroGLYCERIN (NITROSTAT) 0.4 MG SL tablet Place 1 tablet (0.4 mg total) under the tongue every 5 (five) minutes as needed for chest pain. 12/08/20 03/26/22  Lyn Records, MD  rosuvastatin (CRESTOR) 40 MG tablet Take 40 mg by mouth daily. Pt takes 1tablet 40 mg once a day    [provider]  TRULICITY 0.75 MG/0.5ML SOPN Inject into the skin once a week. 11/21/21   [provider]    Physical Exam    Vital Signs:  Jessica Maddox does not have vital signs available for review today.  Given telephonic nature of communication, physical exam is limited. AAOx3. NAD. Normal affect.  Speech and respirations are unlabored.  Accessory Clinical Findings    None  Assessment & Plan    1.  Preoperative Cardiovascular Risk Assessment:  According to the Revised Cardiac Risk Index (RCRI), her Perioperative Risk of Major Cardiac Event is (%): 6.6. Her Functional Capacity in METs is: 8.97 according to the Duke Activity Status Index (DASI). Therefore, based on ACC/AHA guidelines, patient would be at acceptable risk for the planned procedure without further cardiovascular testing.  Colonoscopy procedure is not scheduled until 07/2022.  The patient was advised that if she develops new symptoms prior to surgery to contact our office to arrange for a follow-up visit, and she verbalized understanding.  Per Dr. Katrinka Blazing, primary cardiologist, Plavix may be discontinued at this time.  She should continue aspirin 81 mg daily going forward.  If necessary, she may hold aspirin for 5-7 days prior to procedure. Please resume Aspirin as soon as possible postprocedure, at the discretion of the surgeon.  A copy of this note will be routed to requesting surgeon.  Time:   Today, I have spent 5 minutes with the patient with telehealth technology discussing medical history, symptoms, and management  plan.     Joylene Grapes, NP  03/27/2022, 9:29 AM

## 2022-04-14 IMAGING — DX DG CHEST 2V
2 series · 2 of 2 positions shown · non-contrast
Comparison: None.

CLINICAL DATA: Chest pain

EXAM:
CHEST - 2 VIEW

[chest pa]
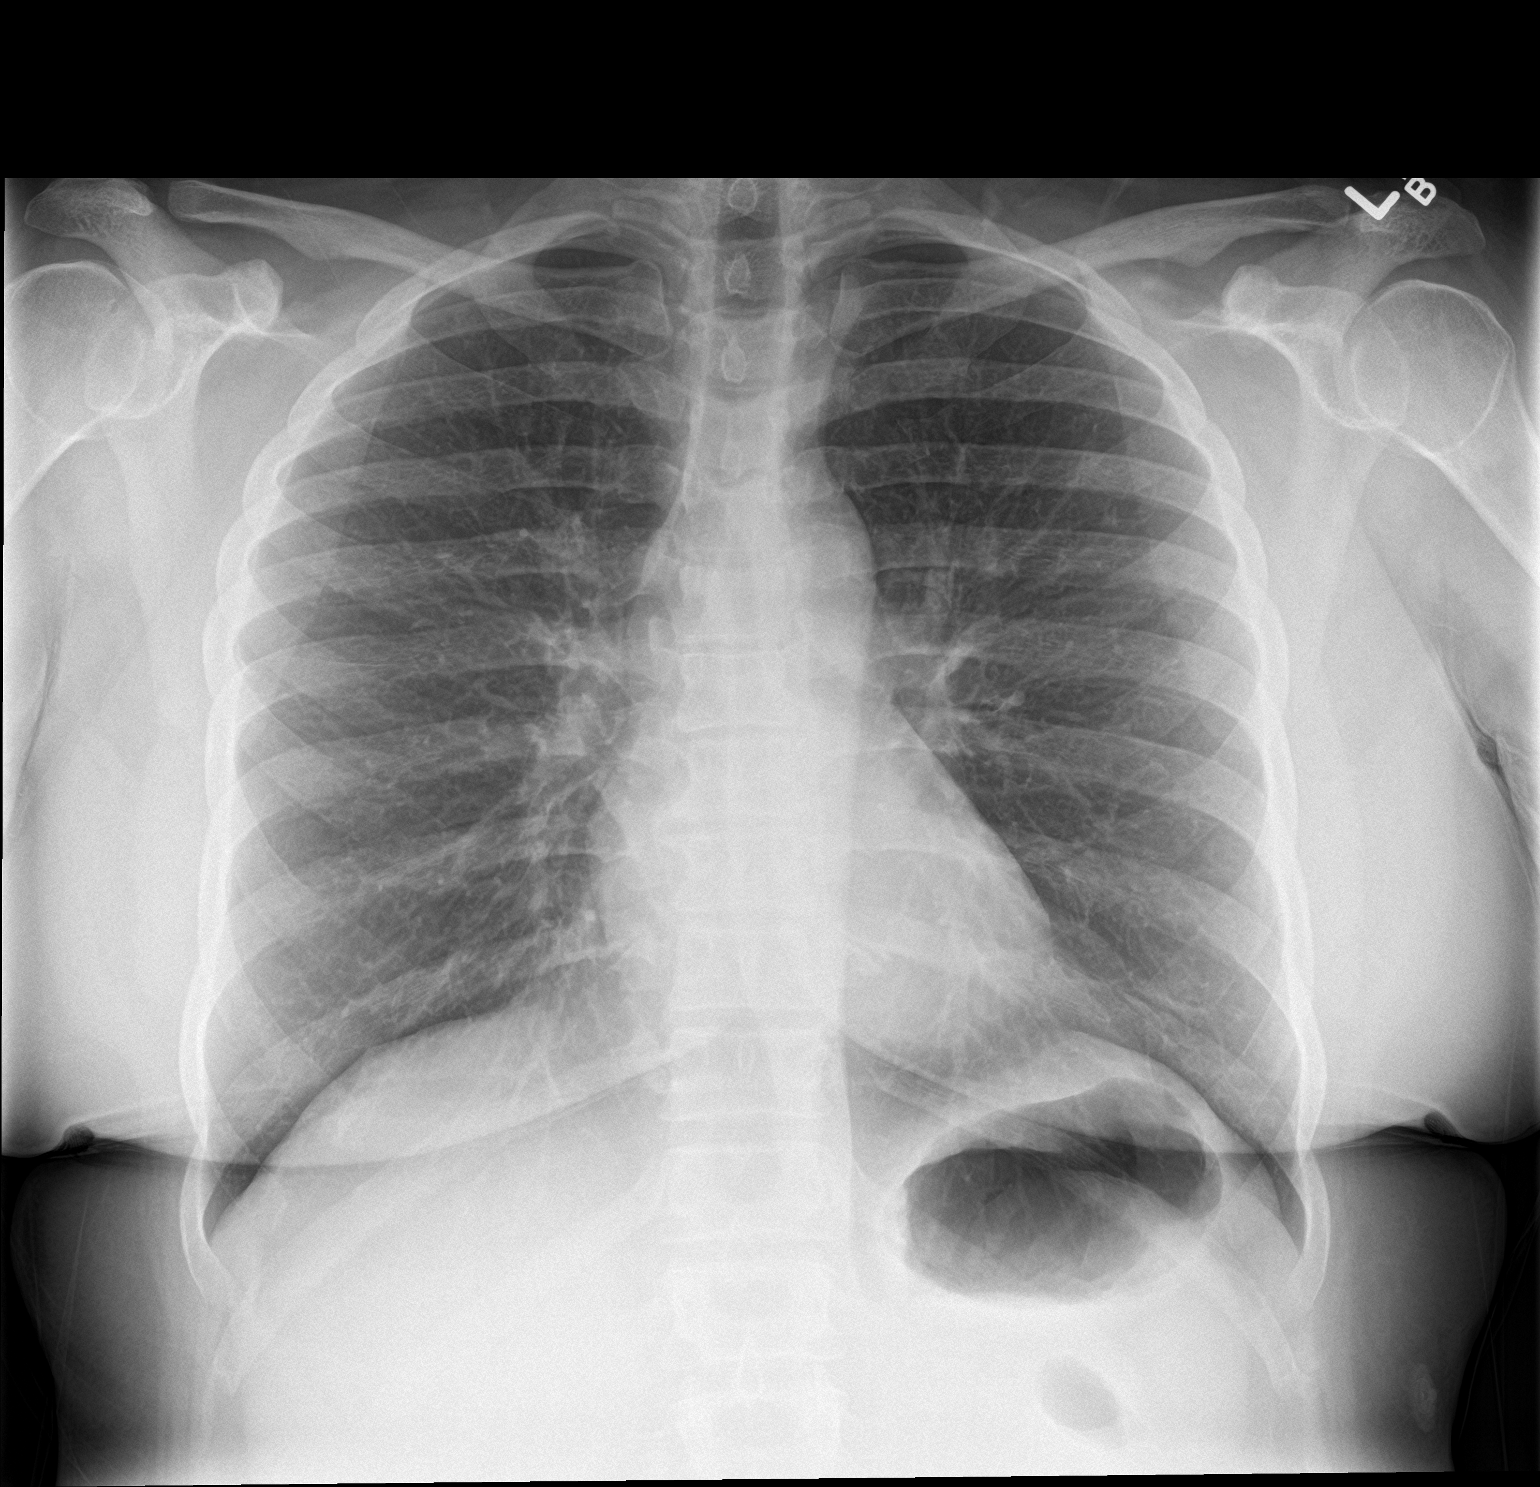

[chest lat]
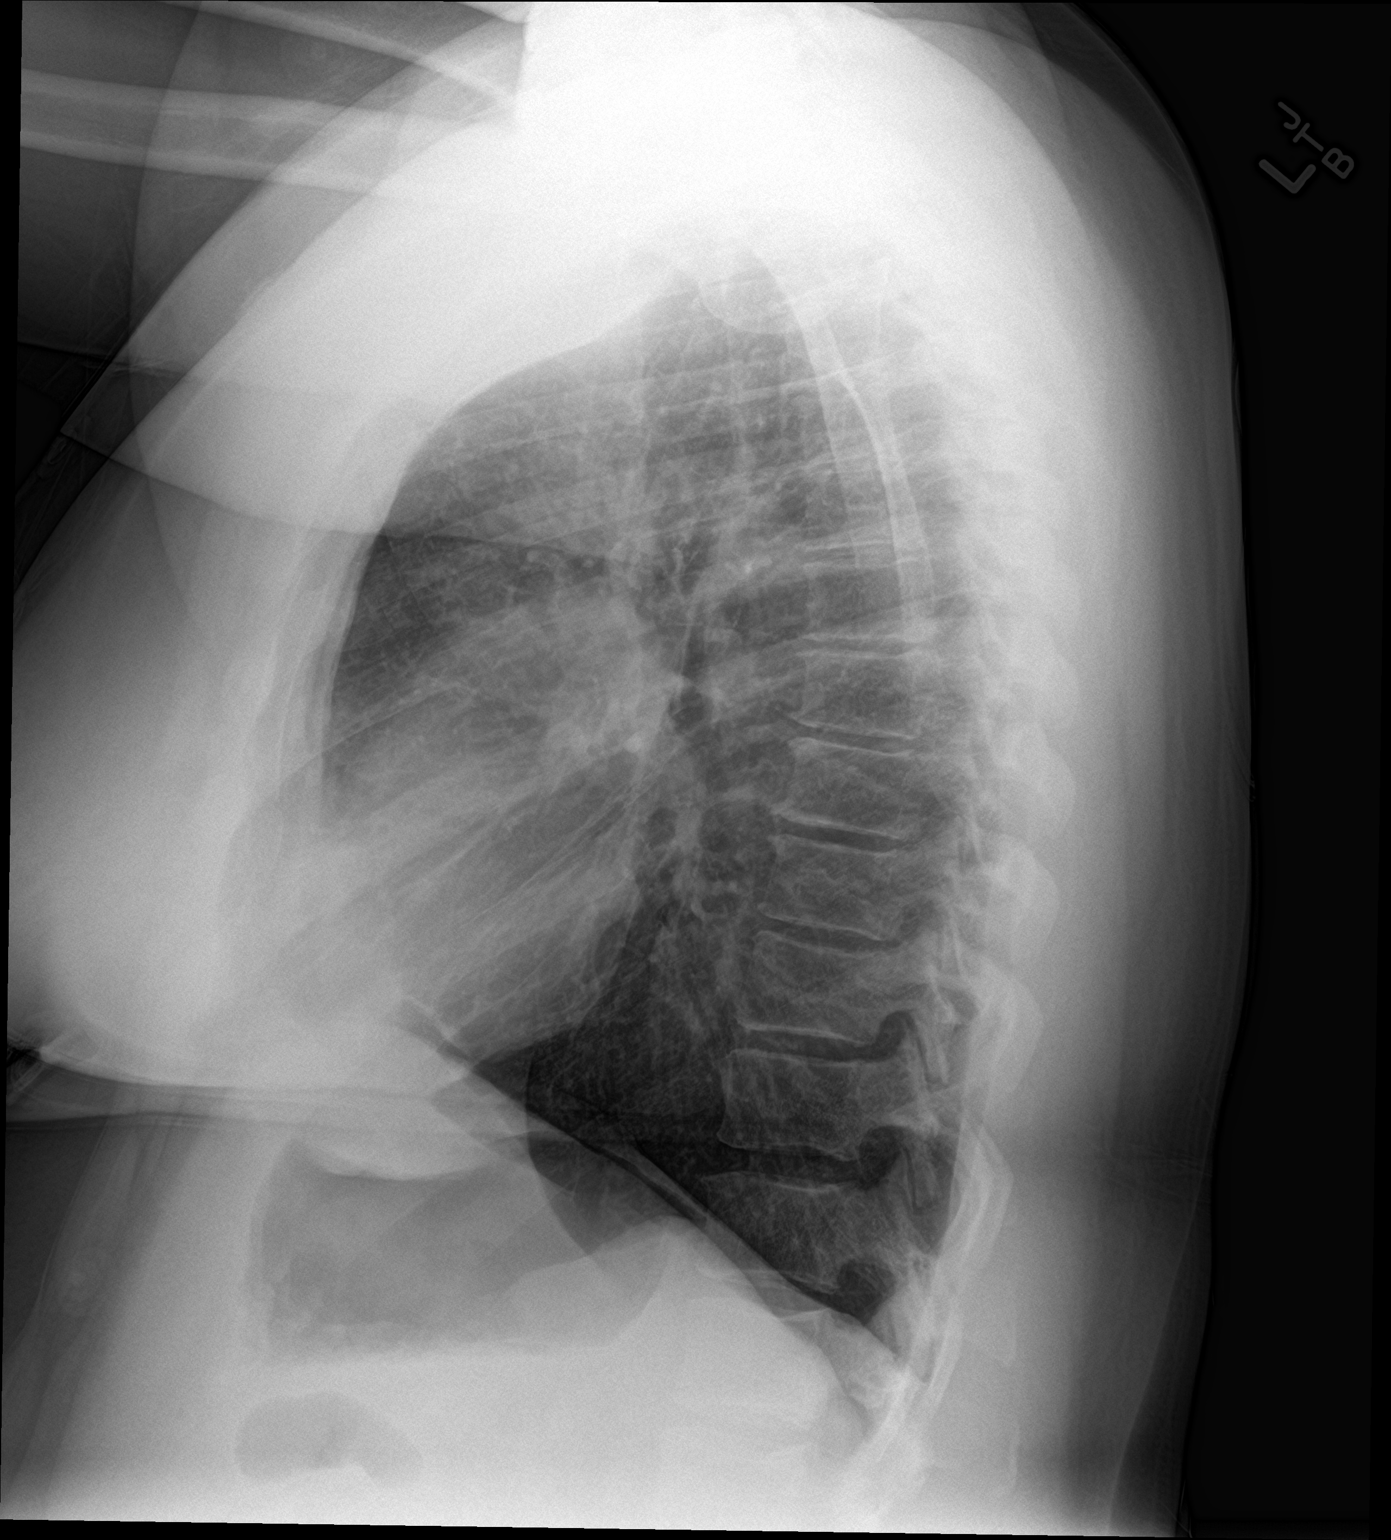

[2 of 2 positions shown; findings below may reference images not displayed]

FINDINGS: The heart size and mediastinal contours are within normal limits.
Both lungs are clear. The visualized skeletal structures are
unremarkable.
IMPRESSION: No active cardiopulmonary disease.

## 2022-07-24 ENCOUNTER — Encounter: Admission: RE | Payer: Self-pay | Source: Home / Self Care

## 2022-07-24 ENCOUNTER — Ambulatory Visit
Admission: RE | Admit: 2022-07-24 | Payer: No Typology Code available for payment source | Source: Home / Self Care | Admitting: Internal Medicine

## 2022-07-24 SURGERY — COLONOSCOPY
Anesthesia: General

## 2022-08-07 ENCOUNTER — Telehealth: Payer: Self-pay | Admitting: Interventional Cardiology

## 2022-08-07 NOTE — Telephone Encounter (Signed)
Pt c/o of Chest Pain: STAT if CP now or developed within 24 hours  1. Are you having CP right now? No, patient denies having any chest pain or chest tightness.   2. Are you experiencing any other symptoms (ex. SOB, nausea, vomiting, sweating)? Nausea and vomiting  3. How long have you been experiencing CP? Heavy indigestion for two days    4. Is your CP continuous or coming and going?   5. Have you taken Nitroglycerin?   Patient called stating she has been having heavy ingestion with nausea for the past two days. She states after she eats she has a lot of belching with then vomiting.  She is not sure if she might be eating to fast.   ?

## 2022-08-07 NOTE — Telephone Encounter (Signed)
Called patient back about her message. Asked patient if she is still taking Trulicity or has had a dose change. Patient stated that she had to switch to ozempic and has been taking it for a month. Informed patient that her nausea and vomiting could be from the ozempic and to call the ordering doctor to let them know. Patient verbalized understanding. While on the phone scheduled patient to see Dr. Shari Prows to establish care with new cardiologist since Dr. Katrinka Blazing retired.

## 2022-10-30 ENCOUNTER — Encounter: Admission: RE | Payer: Self-pay | Source: Home / Self Care

## 2022-10-30 ENCOUNTER — Ambulatory Visit
Admission: RE | Admit: 2022-10-30 | Payer: No Typology Code available for payment source | Source: Home / Self Care | Admitting: Internal Medicine

## 2022-10-30 SURGERY — COLONOSCOPY
Anesthesia: General

## 2022-11-14 ENCOUNTER — Encounter: Payer: Self-pay | Admitting: Cardiology

## 2022-12-25 ENCOUNTER — Ambulatory Visit: Payer: No Typology Code available for payment source | Admitting: Cardiology

## 2023-01-08 ENCOUNTER — Encounter: Payer: Self-pay | Admitting: Internal Medicine

## 2023-01-15 ENCOUNTER — Encounter: Payer: Self-pay | Admitting: Internal Medicine

## 2023-01-15 ENCOUNTER — Encounter
Admission: RE | Disposition: A | Discharge status: Home / Self Care | Payer: Self-pay | Source: Home / Self Care | Attending: Internal Medicine

## 2023-01-15 ENCOUNTER — Ambulatory Visit
Admission: RE | Admit: 2023-01-15 | Discharge: 2023-01-15 | Disposition: A | Discharge status: Home / Self Care | Payer: No Typology Code available for payment source | Attending: Internal Medicine | Admitting: Internal Medicine

## 2023-01-15 ENCOUNTER — Other Ambulatory Visit: Payer: Self-pay

## 2023-01-15 ENCOUNTER — Ambulatory Visit: Payer: No Typology Code available for payment source | Admitting: Anesthesiology

## 2023-01-15 DIAGNOSIS — Z794 Long term (current) use of insulin: Secondary | ICD-10-CM | POA: Diagnosis not present

## 2023-01-15 DIAGNOSIS — E119 Type 2 diabetes mellitus without complications: Secondary | ICD-10-CM | POA: Diagnosis not present

## 2023-01-15 DIAGNOSIS — Z1211 Encounter for screening for malignant neoplasm of colon: Secondary | ICD-10-CM | POA: Insufficient documentation

## 2023-01-15 DIAGNOSIS — Z7984 Long term (current) use of oral hypoglycemic drugs: Secondary | ICD-10-CM | POA: Insufficient documentation

## 2023-01-15 DIAGNOSIS — I10 Essential (primary) hypertension: Secondary | ICD-10-CM | POA: Insufficient documentation

## 2023-01-15 DIAGNOSIS — Z7985 Long-term (current) use of injectable non-insulin antidiabetic drugs: Secondary | ICD-10-CM | POA: Insufficient documentation

## 2023-01-15 DIAGNOSIS — I252 Old myocardial infarction: Secondary | ICD-10-CM | POA: Diagnosis not present

## 2023-01-15 HISTORY — PX: COLONOSCOPY WITH PROPOFOL: SHX5780

## 2023-01-15 LAB — GLUCOSE, CAPILLARY: Glucose-Capillary: 169 mg/dL — ABNORMAL HIGH (ref 70–99)

## 2023-01-15 SURGERY — COLONOSCOPY WITH PROPOFOL
Anesthesia: General

## 2023-01-15 MED ORDER — LIDOCAINE HCL (PF) 1 % IJ SOLN
INTRAMUSCULAR | Status: DC | PRN
Start: 2023-01-15 — End: 2023-01-15
  Administered 2023-01-15: 40 mg

## 2023-01-15 MED ORDER — PROPOFOL 10 MG/ML IV BOLUS
INTRAVENOUS | Status: DC | PRN
Start: 2023-01-15 — End: 2023-01-15
  Administered 2023-01-15: 150 ug/kg/min via INTRAVENOUS

## 2023-01-15 MED ORDER — SODIUM CHLORIDE 0.9 % IV SOLN: INTRAVENOUS | Status: DC

## 2023-01-15 MED ORDER — PROPOFOL 10 MG/ML IV BOLUS
INTRAVENOUS | Status: AC
  Filled 2023-01-15: qty 20

## 2023-01-15 NOTE — Interval H&P Note (Signed)
History and Physical Interval Note:  01/15/2023 10:52 AM  Jessica Maddox  has presented today for surgery, with the diagnosis of Z12.11 (ICD-10-CM) - Colon cancer screening.  The various methods of treatment have been discussed with the patient and family. After consideration of risks, benefits and other options for treatment, the patient has consented to  Procedure(s): COLONOSCOPY WITH PROPOFOL (N/A) as a surgical intervention.  The patient's history has been reviewed, patient examined, no change in status, stable for surgery.  I have reviewed the patient's chart and labs.  Questions were answered to the patient's satisfaction.     [place], [place]

## 2023-01-15 NOTE — H&P (Signed)
Outpatient short stay form Pre-procedure 01/15/2023 10:51 AM Jessica Maddox K. Norma Fredrickson, M.D.  Primary Physician: Marisue Ivan, M.D.  Reason for visit:  Colon cancer screening  History of present illness:  Patient presents for colonoscopy for colon cancer screening. The patient denies complaints of abdominal pain, significant change in bowel habits, or rectal bleeding.      Current Facility-Administered Medications:    0.9 %  sodium chloride infusion, , Intravenous, Continuous, Tytianna Greenley, [name], MD, Last Rate: 20 mL/hr at 01/15/23 1041, Continued from Pre-op at 01/15/23 1041  Medications Prior to Admission  Medication Sig Dispense Refill Last Dose   dapagliflozin propanediol (FARXIGA) 10 MG TABS tablet Take 1 tablet (10 mg total) by mouth daily before breakfast. 90 tablet 3 01/14/2023   ezetimibe (ZETIA) 10 MG tablet Take 1 tablet by mouth once daily 90 tablet 2 01/14/2023   metoprolol succinate (TOPROL-XL) 50 MG 24 hr tablet TAKE 1 TABLET BY MOUTH ONCE DAILY. TAKE WITH OR IMMEDIATELY FOLLOWING A MEAL 90 tablet 3 01/14/2023   rosuvastatin (CRESTOR) 40 MG tablet Take 40 mg by mouth daily. Pt takes 1tablet 40 mg once a day   01/14/2023   aspirin EC 81 MG tablet Take 81 mg by mouth daily. Swallow whole.   01/11/2023   Continuous Blood Gluc Receiver (DEXCOM G7 RECEIVER) DEVI as directed.      Continuous Blood Gluc Sensor (DEXCOM G7 SENSOR) MISC as directed.      insulin aspart (NOVOLOG) 100 UNIT/ML injection Inject 7 Units into the skin 3 (three) times daily before meals. (Patient not taking: Reported on 03/26/2022)      insulin glargine (LANTUS) 100 UNIT/ML injection Inject 20 Units into the skin at bedtime. (Patient not taking: Reported on 03/26/2022)      metFORMIN (GLUCOPHAGE) 500 MG tablet Take 1 tablet (500 mg total) by mouth 2 (two) times daily with a meal. 60 tablet 11 01/12/2023   nitroGLYCERIN (NITROSTAT) 0.4 MG SL tablet Place 1 tablet (0.4 mg total) under the tongue every 5 (five) minutes as  needed for chest pain. 25 tablet 1    TRULICITY 0.75 MG/0.5ML SOPN Inject into the skin once a week.   01/05/2023     Allergies  Allergen Reactions   Iodine Anaphylaxis   Shellfish Allergy Anaphylaxis     Past Medical History:  Diagnosis Date   Diabetes mellitus without complication (HCC)     Review of systems:  Otherwise negative.    Physical Exam  Gen: Alert, oriented. Appears stated age.  HEENT: [place]/AT. PERRLA. Lungs: CTA, no wheezes. CV: RR nl S1, S2. Abd: soft, benign, no masses. BS+ Ext: No edema. Pulses 2+    Planned procedures: Proceed with colonoscopy. The patient understands the nature of the planned procedure, indications, risks, alternatives and potential complications including but not limited to bleeding, infection, perforation, damage to internal organs and possible oversedation/side effects from anesthesia. The patient agrees and gives consent to proceed.  Please refer to procedure notes for findings, recommendations and patient disposition/instructions.     [name] K. Norma Fredrickson, M.D. Gastroenterology 01/15/2023  10:51 AM

## 2023-01-15 NOTE — Anesthesia Postprocedure Evaluation (Signed)
Anesthesia Post Note  Patient: [employer] account executive  Procedure(s) Performed: COLONOSCOPY WITH PROPOFOL  Patient location during evaluation: Endoscopy Anesthesia Type: General Level of consciousness: awake and alert Pain management: pain level controlled Vital Signs Assessment: post-procedure vital signs reviewed and stable Respiratory status: spontaneous breathing, nonlabored ventilation, respiratory function stable and patient connected to nasal cannula oxygen Cardiovascular status: blood pressure returned to baseline and stable Postop Assessment: no apparent nausea or vomiting Anesthetic complications: no   No notable events documented.   Last Vitals:  Vitals:   01/15/23 1127 01/15/23 1137  BP: (!) 141/84 (!) 148/87  Pulse: 81 70  Resp: 12 12  Temp:    SpO2: 100% 100%    Last Pain:  Vitals:   01/15/23 1137  TempSrc:   PainSc: 0-No pain                 Cleda Mccreedy Emanii Bugbee

## 2023-01-15 NOTE — Anesthesia Preprocedure Evaluation (Signed)
Anesthesia Evaluation  Patient identified by MRN, date of birth, ID band Patient awake    Reviewed: Allergy & Precautions, NPO status , Patient's Chart, lab work & pertinent test results  History of Anesthesia Complications Negative for: history of anesthetic complications  Airway Mallampati: III  TM Distance: <3 FB Neck ROM: full    Dental  (+) Chipped, Poor Dentition, Missing, Partial Upper   Pulmonary neg pulmonary ROS, neg shortness of breath   Pulmonary exam normal        Cardiovascular Exercise Tolerance: Good hypertension, (-) angina + Past MI  Normal cardiovascular exam     Neuro/Psych negative neurological ROS  negative psych ROS   GI/Hepatic negative GI ROS, Neg liver ROS,neg GERD  ,,  Endo/Other  diabetes    Renal/GU negative Renal ROS  negative genitourinary   Musculoskeletal   Abdominal   Peds  Hematology negative hematology ROS (+)   Anesthesia Other Findings Past Medical History: No date: Diabetes mellitus without complication (HCC)  Past Surgical History: 12/06/2020: CORONARY/GRAFT ACUTE MI REVASCULARIZATION; N/A     Comment:  Procedure: Coronary/Graft Acute MI Revascularization;                Surgeon: Lyn Records, MD;  Location: [facility] INVASIVE CV               LAB;  Service: Cardiovascular;  Laterality: N/A; 12/06/2020: LEFT HEART CATH AND CORONARY ANGIOGRAPHY; N/A     Comment:  Procedure: LEFT HEART CATH AND CORONARY ANGIOGRAPHY;                Surgeon: Lyn Records, MD;  Location: [facility] INVASIVE CV               LAB;  Service: Cardiovascular;  Laterality: N/A; No date: TUBAL LIGATION     Reproductive/Obstetrics negative OB ROS                             Anesthesia Physical Anesthesia Plan  ASA: 3  Anesthesia Plan: General   Post-op Pain Management:    Induction: Intravenous  PONV Risk Score and Plan: Propofol infusion and TIVA  Airway Management  Planned: Natural Airway and Nasal Cannula  Additional Equipment:   Intra-op Plan:   Post-operative Plan:   Informed Consent: I have reviewed the patients History and Physical, chart, labs and discussed the procedure including the risks, benefits and alternatives for the proposed anesthesia with the patient or authorized representative who has indicated his/her understanding and acceptance.     Dental Advisory Given  Plan Discussed with: Anesthesiologist, CRNA and Surgeon  Anesthesia Plan Comments: (Patient consented for risks of anesthesia including but not limited to:  - adverse reactions to medications - risk of airway placement if required - damage to eyes, teeth, lips or other oral mucosa - nerve damage due to positioning  - sore throat or hoarseness - Damage to heart, brain, nerves, lungs, other parts of body or loss of life  Patient voiced understanding.)       Anesthesia Quick Evaluation

## 2023-01-15 NOTE — Op Note (Signed)
[facility] Gastroenterology Patient Name: Jessica Maddox Procedure Date: 01/15/2023 10:56 AM MRN: [id number] Account #: [id number] Date of Birth: 18-Dec-1970 Admit Type: Outpatient Age: 52 Room: [facility] ENDO ROOM 2 Gender: Female Note Status: Finalized Instrument Name: Prentice Docker [id number] Procedure:             Colonoscopy Indications:           Screening for colorectal malignant neoplasm Providers:             Royce Macadamia K. Norma Fredrickson MD, MD Referring MD:          Marisue Ivan (Referring MD) Medicines:             Propofol per Anesthesia Complications:         No immediate complications. Procedure:             Pre-Anesthesia Assessment:                        - The risks and benefits of the procedure and the                         sedation options and risks were discussed with the                         patient. All questions were answered and informed                         consent was obtained.                        - Patient identification and proposed procedure were                         verified prior to the procedure by the nurse. The                         procedure was verified in the procedure room.                        - ASA Grade Assessment: III - A patient with severe                         systemic disease.                        - After reviewing the risks and benefits, the patient                         was deemed in satisfactory condition to undergo the                         procedure.                        After obtaining informed consent, the colonoscope was                         passed under direct vision. Throughout the procedure,                         the patient's  blood pressure, pulse, and oxygen                         saturations were monitored continuously. The                         Colonoscope was introduced through the anus and                         advanced to the the cecum, identified by appendiceal                          orifice and ileocecal valve. The colonoscopy was                         performed without difficulty. The patient tolerated                         the procedure well. The quality of the bowel                         preparation was good. The ileocecal valve, appendiceal                         orifice, and rectum were photographed. Findings:      The perianal and digital rectal examinations were normal. Pertinent       negatives include normal sphincter tone and no palpable rectal lesions.      The entire examined colon appeared normal on direct and retroflexion       views. Impression:            - The entire examined colon is normal on direct and                         retroflexion views.                        - No specimens collected. Recommendation:        - Patient has a contact number available for                         emergencies. The signs and symptoms of potential                         delayed complications were discussed with the patient.                         Return to normal activities tomorrow. Written                         discharge instructions were provided to the patient.                        - Resume previous diet.                        - Continue present medications.                        - Repeat colonoscopy in 10  years for screening                         purposes.                        - Return to GI office PRN.                        - The findings and recommendations were discussed with                         the patient. Procedure Code(s):     --- Professional ---                        Z6109, Colorectal cancer screening; colonoscopy on                         individual not meeting criteria for high risk Diagnosis Code(s):     --- Professional ---                        Z12.11, Encounter for screening for malignant neoplasm                         of colon CPT copyright 2022 [employer]. All rights reserved. The  codes documented in this report are preliminary and upon coder review may  be revised to meet current compliance requirements. Stanton Kidney MD, MD 01/15/2023 11:18:13 AM This report has been signed electronically. Number of Addenda: 0 Note Initiated On: 01/15/2023 10:56 AM Scope Withdrawal Time: 0 hours 6 minutes 10 seconds  Total Procedure Duration: 0 hours 10 minutes 25 seconds  Estimated Blood Loss:  Estimated blood loss: none.      [facility]

## 2023-01-15 NOTE — Transfer of Care (Signed)
Immediate Anesthesia Transfer of Care Note  Patient: Jessica Maddox  Procedure(s) Performed: COLONOSCOPY WITH PROPOFOL  Patient Location: PACU  Anesthesia Type: General  Level of Consciousness: awake, alert  and patient cooperative  Airway and Oxygen Therapy: Patient Spontanous Breathing and Patient connected to supplemental oxygen  Post-op Assessment: Post-op Vital signs reviewed, Patient's Cardiovascular Status Stable, Respiratory Function Stable, Patent Airway and No signs of Nausea or vomiting  Post-op Vital Signs: Reviewed and stable  Complications: No notable events documented.

## 2023-01-16 ENCOUNTER — Encounter: Payer: Self-pay | Admitting: Internal Medicine

## 2023-03-25 ENCOUNTER — Other Ambulatory Visit: Payer: Self-pay

## 2023-03-25 MED ORDER — EZETIMIBE 10 MG PO TABS: 10.0000 mg | ORAL_TABLET | Freq: Every day | ORAL | 0 refills | Status: DC

## 2023-05-20 ENCOUNTER — Other Ambulatory Visit: Payer: Self-pay | Admitting: Nurse Practitioner

## 2023-06-27 ENCOUNTER — Other Ambulatory Visit: Payer: Self-pay | Admitting: Nurse Practitioner

## 2023-08-26 ENCOUNTER — Other Ambulatory Visit: Payer: Self-pay | Admitting: Nurse Practitioner

## 2023-10-23 ENCOUNTER — Other Ambulatory Visit: Payer: Self-pay | Admitting: Nurse Practitioner

## 2023-10-30 ENCOUNTER — Other Ambulatory Visit: Payer: Self-pay | Admitting: Nurse Practitioner

## 2023-11-04 ENCOUNTER — Other Ambulatory Visit: Payer: Self-pay

## 2023-11-04 MED ORDER — METOPROLOL SUCCINATE ER 50 MG PO TB24: 50.0000 mg | ORAL_TABLET | Freq: Every day | ORAL | 0 refills | Status: AC

## 2023-12-03 ENCOUNTER — Other Ambulatory Visit: Payer: Self-pay | Admitting: Nurse Practitioner

## 2023-12-16 ENCOUNTER — Ambulatory Visit: Admitting: Cardiology
